# Patient Record
Sex: Female | Born: 1950
Health system: Southern US, Community
[De-identification: ages and names within clinical notes are randomized; demographics above are authoritative.]

## PROBLEM LIST (undated history)

## (undated) DIAGNOSIS — I2510 Atherosclerotic heart disease of native coronary artery without angina pectoris: Secondary | ICD-10-CM

## (undated) DIAGNOSIS — I739 Peripheral vascular disease, unspecified: Secondary | ICD-10-CM

## (undated) DIAGNOSIS — I6529 Occlusion and stenosis of unspecified carotid artery: Secondary | ICD-10-CM

## (undated) DIAGNOSIS — E785 Hyperlipidemia, unspecified: Secondary | ICD-10-CM

## (undated) HISTORY — DX: Occlusion and stenosis of unspecified carotid artery: I65.29

## (undated) HISTORY — DX: Atherosclerotic heart disease of native coronary artery without angina pectoris: I25.10

## (undated) HISTORY — DX: Peripheral vascular disease, unspecified: I73.9

## (undated) HISTORY — DX: Hyperlipidemia, unspecified: E78.5

---

## 1999-11-28 ENCOUNTER — Other Ambulatory Visit: Admission: RE | Admit: 1999-11-28 | Discharge: 1999-11-28 | Payer: Self-pay | Admitting: Orthopedic Surgery

## 1999-12-28 ENCOUNTER — Encounter: Payer: Self-pay | Admitting: Gynecology

## 1999-12-28 ENCOUNTER — Encounter: Admission: RE | Admit: 1999-12-28 | Discharge: 1999-12-28 | Payer: Self-pay | Admitting: Gynecology

## 2001-01-06 ENCOUNTER — Encounter: Admission: RE | Admit: 2001-01-06 | Discharge: 2001-01-06 | Payer: Self-pay | Admitting: Gynecology

## 2001-01-06 ENCOUNTER — Encounter: Payer: Self-pay | Admitting: Gynecology

## 2001-02-12 ENCOUNTER — Other Ambulatory Visit: Admission: RE | Admit: 2001-02-12 | Discharge: 2001-02-12 | Payer: Self-pay | Admitting: Gynecology

## 2002-01-16 ENCOUNTER — Encounter: Admission: RE | Admit: 2002-01-16 | Discharge: 2002-01-16 | Payer: Self-pay | Admitting: Gynecology

## 2002-01-16 ENCOUNTER — Encounter: Payer: Self-pay | Admitting: Gynecology

## 2002-02-16 ENCOUNTER — Other Ambulatory Visit: Admission: RE | Admit: 2002-02-16 | Discharge: 2002-02-16 | Payer: Self-pay | Admitting: Gynecology

## 2003-02-23 ENCOUNTER — Encounter: Admission: RE | Admit: 2003-02-23 | Discharge: 2003-02-23 | Payer: Self-pay | Admitting: Gynecology

## 2003-02-23 ENCOUNTER — Encounter: Payer: Self-pay | Admitting: Gynecology

## 2003-02-23 ENCOUNTER — Other Ambulatory Visit: Admission: RE | Admit: 2003-02-23 | Discharge: 2003-02-23 | Payer: Self-pay | Admitting: Gynecology

## 2003-10-04 ENCOUNTER — Ambulatory Visit (HOSPITAL_COMMUNITY): Admission: RE | Admit: 2003-10-04 | Discharge: 2003-10-04 | Payer: Self-pay | Admitting: *Deleted

## 2004-03-07 ENCOUNTER — Encounter: Admission: RE | Admit: 2004-03-07 | Discharge: 2004-03-07 | Payer: Self-pay | Admitting: Gynecology

## 2004-03-13 ENCOUNTER — Other Ambulatory Visit: Admission: RE | Admit: 2004-03-13 | Discharge: 2004-03-13 | Payer: Self-pay | Admitting: Gynecology

## 2005-03-14 ENCOUNTER — Other Ambulatory Visit: Admission: RE | Admit: 2005-03-14 | Discharge: 2005-03-14 | Payer: Self-pay | Admitting: Gynecology

## 2005-03-16 ENCOUNTER — Encounter: Admission: RE | Admit: 2005-03-16 | Discharge: 2005-03-16 | Payer: Self-pay | Admitting: Gynecology

## 2005-04-04 ENCOUNTER — Encounter: Admission: RE | Admit: 2005-04-04 | Discharge: 2005-04-04 | Payer: Self-pay | Admitting: Gynecology

## 2005-06-07 ENCOUNTER — Encounter: Admission: RE | Admit: 2005-06-07 | Discharge: 2005-06-07 | Payer: Self-pay

## 2005-07-06 ENCOUNTER — Encounter: Admission: RE | Admit: 2005-07-06 | Discharge: 2005-07-06 | Payer: Self-pay | Admitting: Neurology

## 2006-03-15 ENCOUNTER — Other Ambulatory Visit: Admission: RE | Admit: 2006-03-15 | Discharge: 2006-03-15 | Payer: Self-pay | Admitting: Gynecology

## 2006-04-01 ENCOUNTER — Encounter: Admission: RE | Admit: 2006-04-01 | Discharge: 2006-04-01 | Payer: Self-pay | Admitting: Gynecology

## 2006-04-09 ENCOUNTER — Encounter: Admission: RE | Admit: 2006-04-09 | Discharge: 2006-04-09 | Payer: Self-pay | Admitting: Gynecology

## 2007-03-18 ENCOUNTER — Other Ambulatory Visit: Admission: RE | Admit: 2007-03-18 | Discharge: 2007-03-18 | Payer: Self-pay | Admitting: Gynecology

## 2007-04-09 ENCOUNTER — Encounter: Admission: RE | Admit: 2007-04-09 | Discharge: 2007-04-09 | Payer: Self-pay | Admitting: Gynecology

## 2008-04-13 ENCOUNTER — Encounter: Admission: RE | Admit: 2008-04-13 | Discharge: 2008-04-13 | Payer: Self-pay | Admitting: Gynecology

## 2008-04-20 ENCOUNTER — Encounter: Admission: RE | Admit: 2008-04-20 | Discharge: 2008-04-20 | Payer: Self-pay | Admitting: Gynecology

## 2009-04-26 ENCOUNTER — Encounter: Admission: RE | Admit: 2009-04-26 | Discharge: 2009-04-26 | Payer: Self-pay | Admitting: Gynecology

## 2009-09-07 ENCOUNTER — Inpatient Hospital Stay (HOSPITAL_COMMUNITY): Admission: EM | Admit: 2009-09-07 | Discharge: 2009-09-14 | Payer: Self-pay | Admitting: Emergency Medicine

## 2009-09-08 ENCOUNTER — Encounter: Payer: Self-pay | Admitting: Cardiothoracic Surgery

## 2009-09-08 ENCOUNTER — Encounter (INDEPENDENT_AMBULATORY_CARE_PROVIDER_SITE_OTHER): Payer: Self-pay | Admitting: Cardiovascular Disease

## 2009-09-08 ENCOUNTER — Ambulatory Visit: Payer: Self-pay | Admitting: Cardiothoracic Surgery

## 2009-09-08 HISTORY — PX: CARDIAC CATHETERIZATION: SHX172

## 2009-09-08 HISTORY — PX: OTHER SURGICAL HISTORY: SHX169

## 2009-09-09 HISTORY — PX: CORONARY ARTERY BYPASS GRAFT: SHX141

## 2009-10-06 ENCOUNTER — Encounter: Admission: RE | Admit: 2009-10-06 | Discharge: 2009-10-06 | Payer: Self-pay | Admitting: Cardiothoracic Surgery

## 2009-10-06 ENCOUNTER — Ambulatory Visit: Payer: Self-pay | Admitting: Cardiothoracic Surgery

## 2009-11-28 HISTORY — PX: NM MYOCAR PERF WALL MOTION: HXRAD629

## 2010-01-05 ENCOUNTER — Encounter: Admission: RE | Admit: 2010-01-05 | Discharge: 2010-01-05 | Payer: Self-pay | Admitting: Internal Medicine

## 2010-05-03 ENCOUNTER — Encounter: Admission: RE | Admit: 2010-05-03 | Discharge: 2010-05-03 | Payer: Self-pay | Admitting: Gynecology

## 2010-08-13 ENCOUNTER — Encounter: Payer: Self-pay | Admitting: Gynecology

## 2010-08-14 ENCOUNTER — Encounter: Payer: Self-pay | Admitting: Gynecology

## 2010-10-12 LAB — CBC
HCT: 27.8 % — ABNORMAL LOW (ref 36.0–46.0)
HCT: 28.6 % — ABNORMAL LOW (ref 36.0–46.0)
HCT: 28.8 % — ABNORMAL LOW (ref 36.0–46.0)
HCT: 29.4 % — ABNORMAL LOW (ref 36.0–46.0)
HCT: 30 % — ABNORMAL LOW (ref 36.0–46.0)
HCT: 31 % — ABNORMAL LOW (ref 36.0–46.0)
HCT: 37.5 % (ref 36.0–46.0)
Hemoglobin: 10.1 g/dL — ABNORMAL LOW (ref 12.0–15.0)
Hemoglobin: 10.2 g/dL — ABNORMAL LOW (ref 12.0–15.0)
Hemoglobin: 10.5 g/dL — ABNORMAL LOW (ref 12.0–15.0)
Hemoglobin: 9.3 g/dL — ABNORMAL LOW (ref 12.0–15.0)
Hemoglobin: 9.7 g/dL — ABNORMAL LOW (ref 12.0–15.0)
Hemoglobin: 9.8 g/dL — ABNORMAL LOW (ref 12.0–15.0)
MCHC: 33.7 g/dL (ref 30.0–36.0)
MCHC: 33.8 g/dL (ref 30.0–36.0)
MCHC: 33.9 g/dL (ref 30.0–36.0)
MCHC: 34 g/dL (ref 30.0–36.0)
MCHC: 34 g/dL (ref 30.0–36.0)
MCHC: 34 g/dL (ref 30.0–36.0)
MCHC: 34.3 g/dL (ref 30.0–36.0)
MCV: 92.9 fL (ref 78.0–100.0)
MCV: 93.7 fL (ref 78.0–100.0)
MCV: 93.8 fL (ref 78.0–100.0)
MCV: 93.9 fL (ref 78.0–100.0)
MCV: 94 fL (ref 78.0–100.0)
MCV: 94.1 fL (ref 78.0–100.0)
MCV: 94.4 fL (ref 78.0–100.0)
MCV: 95.1 fL (ref 78.0–100.0)
Platelets: 104 10*3/uL — ABNORMAL LOW (ref 150–400)
Platelets: 105 10*3/uL — ABNORMAL LOW (ref 150–400)
Platelets: 106 10*3/uL — ABNORMAL LOW (ref 150–400)
Platelets: 110 10*3/uL — ABNORMAL LOW (ref 150–400)
Platelets: 135 10*3/uL — ABNORMAL LOW (ref 150–400)
Platelets: 185 10*3/uL (ref 150–400)
Platelets: 206 10*3/uL (ref 150–400)
Platelets: 92 10*3/uL — ABNORMAL LOW (ref 150–400)
Platelets: 94 10*3/uL — ABNORMAL LOW (ref 150–400)
RBC: 2.95 MIL/uL — ABNORMAL LOW (ref 3.87–5.11)
RBC: 3.05 MIL/uL — ABNORMAL LOW (ref 3.87–5.11)
RBC: 3.07 MIL/uL — ABNORMAL LOW (ref 3.87–5.11)
RBC: 3.17 MIL/uL — ABNORMAL LOW (ref 3.87–5.11)
RBC: 3.2 MIL/uL — ABNORMAL LOW (ref 3.87–5.11)
RBC: 3.26 MIL/uL — ABNORMAL LOW (ref 3.87–5.11)
RDW: 14.1 % (ref 11.5–15.5)
RDW: 14.1 % (ref 11.5–15.5)
RDW: 14.3 % (ref 11.5–15.5)
RDW: 14.3 % (ref 11.5–15.5)
RDW: 14.3 % (ref 11.5–15.5)
RDW: 14.3 % (ref 11.5–15.5)
RDW: 14.4 % (ref 11.5–15.5)
RDW: 14.6 % (ref 11.5–15.5)
RDW: 14.8 % (ref 11.5–15.5)
WBC: 10.2 10*3/uL (ref 4.0–10.5)
WBC: 10.2 10*3/uL (ref 4.0–10.5)
WBC: 10.7 10*3/uL — ABNORMAL HIGH (ref 4.0–10.5)
WBC: 11.6 10*3/uL — ABNORMAL HIGH (ref 4.0–10.5)
WBC: 12.9 10*3/uL — ABNORMAL HIGH (ref 4.0–10.5)
WBC: 15.2 10*3/uL — ABNORMAL HIGH (ref 4.0–10.5)
WBC: 16.3 10*3/uL — ABNORMAL HIGH (ref 4.0–10.5)
WBC: 17.3 10*3/uL — ABNORMAL HIGH (ref 4.0–10.5)
WBC: 17.4 10*3/uL — ABNORMAL HIGH (ref 4.0–10.5)

## 2010-10-12 LAB — BASIC METABOLIC PANEL
BUN: 10 mg/dL (ref 6–23)
BUN: 12 mg/dL (ref 6–23)
BUN: 8 mg/dL (ref 6–23)
BUN: 8 mg/dL (ref 6–23)
BUN: 9 mg/dL (ref 6–23)
CO2: 24 mEq/L (ref 19–32)
CO2: 25 mEq/L (ref 19–32)
CO2: 26 mEq/L (ref 19–32)
Calcium: 7.8 mg/dL — ABNORMAL LOW (ref 8.4–10.5)
Calcium: 8 mg/dL — ABNORMAL LOW (ref 8.4–10.5)
Calcium: 8.1 mg/dL — ABNORMAL LOW (ref 8.4–10.5)
Calcium: 8.2 mg/dL — ABNORMAL LOW (ref 8.4–10.5)
Chloride: 104 mEq/L (ref 96–112)
Chloride: 105 mEq/L (ref 96–112)
Chloride: 106 mEq/L (ref 96–112)
Chloride: 110 mEq/L (ref 96–112)
Chloride: 114 mEq/L — ABNORMAL HIGH (ref 96–112)
Creatinine, Ser: 0.71 mg/dL (ref 0.4–1.2)
Creatinine, Ser: 0.74 mg/dL (ref 0.4–1.2)
Creatinine, Ser: 0.76 mg/dL (ref 0.4–1.2)
Creatinine, Ser: 0.76 mg/dL (ref 0.4–1.2)
Creatinine, Ser: 0.77 mg/dL (ref 0.4–1.2)
Creatinine, Ser: 0.8 mg/dL (ref 0.4–1.2)
GFR calc Af Amer: >60 mL/min (ref >60)
GFR calc Af Amer: >60 mL/min (ref >60)
GFR calc Af Amer: >60 mL/min (ref >60)
GFR calc Af Amer: >60 mL/min (ref >60)
GFR calc non Af Amer: >60 mL/min (ref >60)
GFR calc non Af Amer: >60 mL/min (ref >60)
GFR calc non Af Amer: >60 mL/min (ref >60)
GFR calc non Af Amer: >60 mL/min (ref >60)
GFR calc non Af Amer: >60 mL/min (ref >60)
Glucose, Bld: 108 mg/dL — ABNORMAL HIGH (ref 70–99)
Glucose, Bld: 120 mg/dL — ABNORMAL HIGH (ref 70–99)
Glucose, Bld: 123 mg/dL — ABNORMAL HIGH (ref 70–99)
Glucose, Bld: 125 mg/dL — ABNORMAL HIGH (ref 70–99)
Glucose, Bld: 143 mg/dL — ABNORMAL HIGH (ref 70–99)
Potassium: 3.6 mEq/L (ref 3.5–5.1)
Potassium: 3.7 mEq/L (ref 3.5–5.1)
Potassium: 4 mEq/L (ref 3.5–5.1)
Potassium: 4.1 mEq/L (ref 3.5–5.1)
Sodium: 136 mEq/L (ref 135–145)
Sodium: 138 mEq/L (ref 135–145)
Sodium: 141 mEq/L (ref 135–145)

## 2010-10-12 LAB — CK TOTAL AND CKMB (NOT AT ARMC)
CK, MB: 1 ng/mL (ref 0.3–4.0)
Relative Index: INVALID (ref 0.0–2.5)
Total CK: 37 U/L (ref 7–177)

## 2010-10-12 LAB — POCT I-STAT 4, (NA,K, GLUC, HGB,HCT)
Glucose, Bld: 86 mg/dL (ref 70–99)
Glucose, Bld: 98 mg/dL (ref 70–99)
HCT: 26 % — ABNORMAL LOW (ref 36.0–46.0)
HCT: 34 % — ABNORMAL LOW (ref 36.0–46.0)
HCT: 36 % (ref 36.0–46.0)
HCT: 45 % (ref 36.0–46.0)
Hemoglobin: 12.2 g/dL (ref 12.0–15.0)
Hemoglobin: 15.3 g/dL — ABNORMAL HIGH (ref 12.0–15.0)
Hemoglobin: 8.5 g/dL — ABNORMAL LOW (ref 12.0–15.0)
Hemoglobin: 8.8 g/dL — ABNORMAL LOW (ref 12.0–15.0)
Potassium: 3.5 mEq/L (ref 3.5–5.1)
Potassium: 4 mEq/L (ref 3.5–5.1)
Sodium: 145 mEq/L (ref 135–145)

## 2010-10-12 LAB — POCT I-STAT, CHEM 8
Calcium, Ion: 1.12 mmol/L (ref 1.12–1.32)
Calcium, Ion: 1.19 mmol/L (ref 1.12–1.32)
Creatinine, Ser: 0.8 mg/dL (ref 0.4–1.2)
Glucose, Bld: 136 mg/dL — ABNORMAL HIGH (ref 70–99)
Glucose, Bld: 166 mg/dL — ABNORMAL HIGH (ref 70–99)
HCT: 29 % — ABNORMAL LOW (ref 36.0–46.0)
Hemoglobin: 10.2 g/dL — ABNORMAL LOW (ref 12.0–15.0)
TCO2: 23 mmol/L (ref 0–100)
TCO2: 23 mmol/L (ref 0–100)

## 2010-10-12 LAB — BLOOD GAS, ARTERIAL
Acid-base deficit: 0.6 mmol/L (ref 0.0–2.0)
Bicarbonate: 23.4 mEq/L (ref 20.0–24.0)
Drawn by: 129711
O2 Saturation: 96.8 %
Patient temperature: 98.6
TCO2: 24.5 mmol/L (ref 0–100)
pCO2 arterial: 37 mmHg (ref 35.0–45.0)
pH, Arterial: 7.417 — ABNORMAL HIGH (ref 7.350–7.400)
pO2, Arterial: 84.9 mmHg (ref 80.0–100.0)

## 2010-10-12 LAB — COMPREHENSIVE METABOLIC PANEL
ALT: 21 U/L (ref 0–35)
Albumin: 3.8 g/dL (ref 3.5–5.2)
Calcium: 8.8 mg/dL (ref 8.4–10.5)
Chloride: 106 mEq/L (ref 96–112)
GFR calc non Af Amer: >60 mL/min (ref >60)
Sodium: 139 mEq/L (ref 135–145)

## 2010-10-12 LAB — CREATININE, SERUM
Creatinine, Ser: 0.71 mg/dL (ref 0.4–1.2)
Creatinine, Ser: 0.84 mg/dL (ref 0.4–1.2)
GFR calc Af Amer: >60 mL/min (ref >60)
GFR calc Af Amer: >60 mL/min (ref >60)
GFR calc non Af Amer: >60 mL/min (ref >60)
GFR calc non Af Amer: >60 mL/min (ref >60)

## 2010-10-12 LAB — GLUCOSE, CAPILLARY
Glucose-Capillary: 108 mg/dL — ABNORMAL HIGH (ref 70–99)
Glucose-Capillary: 116 mg/dL — ABNORMAL HIGH (ref 70–99)
Glucose-Capillary: 119 mg/dL — ABNORMAL HIGH (ref 70–99)
Glucose-Capillary: 130 mg/dL — ABNORMAL HIGH (ref 70–99)
Glucose-Capillary: 131 mg/dL — ABNORMAL HIGH (ref 70–99)
Glucose-Capillary: 180 mg/dL — ABNORMAL HIGH (ref 70–99)
Glucose-Capillary: 83 mg/dL (ref 70–99)

## 2010-10-12 LAB — POCT I-STAT 3, ART BLOOD GAS (G3+)
Bicarbonate: 23.5 mEq/L (ref 20.0–24.0)
Bicarbonate: 24 mEq/L (ref 20.0–24.0)
Bicarbonate: 24.8 mEq/L — ABNORMAL HIGH (ref 20.0–24.0)
O2 Saturation: 100 %
O2 Saturation: 97 %
O2 Saturation: 98 %
Patient temperature: 35.4
TCO2: 24 mmol/L (ref 0–100)
TCO2: 25 mmol/L (ref 0–100)
TCO2: 26 mmol/L (ref 0–100)
pCO2 arterial: 35.4 mmHg (ref 35.0–45.0)
pCO2 arterial: 37.5 mmHg (ref 35.0–45.0)
pCO2 arterial: 40.1 mmHg (ref 35.0–45.0)
pH, Arterial: 7.386 (ref 7.350–7.400)
pH, Arterial: 7.453 — ABNORMAL HIGH (ref 7.350–7.400)
pO2, Arterial: 323 mmHg — ABNORMAL HIGH (ref 80.0–100.0)
pO2, Arterial: 74 mmHg — ABNORMAL LOW (ref 80.0–100.0)

## 2010-10-12 LAB — MAGNESIUM
Magnesium: 2.4 mg/dL (ref 1.5–2.5)
Magnesium: 2.5 mg/dL (ref 1.5–2.5)
Magnesium: 2.9 mg/dL — ABNORMAL HIGH (ref 1.5–2.5)

## 2010-10-12 LAB — TYPE AND SCREEN
ABO/RH(D): O POS
Antibody Screen: NEGATIVE

## 2010-10-12 LAB — POCT I-STAT 3, VENOUS BLOOD GAS (G3P V)
Acid-base deficit: 3 mmol/L — ABNORMAL HIGH (ref 0.0–2.0)
Bicarbonate: 23 mEq/L (ref 20.0–24.0)
O2 Saturation: 67 %
TCO2: 24 mmol/L (ref 0–100)
pO2, Ven: 37 mmHg (ref 30.0–45.0)

## 2010-10-12 LAB — URINALYSIS, ROUTINE W REFLEX MICROSCOPIC
Bilirubin Urine: NEGATIVE
Glucose, UA: NEGATIVE mg/dL
Ketones, ur: NEGATIVE mg/dL
Nitrite: NEGATIVE
Protein, ur: NEGATIVE mg/dL
Specific Gravity, Urine: 1.013 (ref 1.005–1.030)
Urobilinogen, UA: 0.2 mg/dL (ref 0.0–1.0)
pH: 7 (ref 5.0–8.0)

## 2010-10-12 LAB — LIPID PANEL
Triglycerides: 153 mg/dL — ABNORMAL HIGH (ref <150)
VLDL: 31 mg/dL (ref 0–40)

## 2010-10-12 LAB — BRAIN NATRIURETIC PEPTIDE: Pro B Natriuretic peptide (BNP): 337 pg/mL — ABNORMAL HIGH (ref 0.0–100.0)

## 2010-10-12 LAB — MRSA PCR SCREENING: MRSA by PCR: NEGATIVE

## 2010-10-12 LAB — HEPARIN LEVEL (UNFRACTIONATED)
Heparin Unfractionated: 0.25 IU/mL — ABNORMAL LOW (ref 0.30–0.70)
Heparin Unfractionated: 0.26 IU/mL — ABNORMAL LOW (ref 0.30–0.70)
Heparin Unfractionated: 0.48 IU/mL (ref 0.30–0.70)

## 2010-10-12 LAB — CARDIAC PANEL(CRET KIN+CKTOT+MB+TROPI)
CK, MB: 4 ng/mL (ref 0.3–4.0)
Relative Index: 0.9 (ref 0.0–2.5)
Relative Index: INVALID (ref 0.0–2.5)
Total CK: 436 U/L — ABNORMAL HIGH (ref 7–177)
Troponin I: 0.08 ng/mL — ABNORMAL HIGH (ref 0.00–0.06)
Troponin I: 0.09 ng/mL — ABNORMAL HIGH (ref 0.00–0.06)

## 2010-10-12 LAB — URINE MICROSCOPIC-ADD ON

## 2010-10-12 LAB — PROTIME-INR
INR: 1.42 (ref 0.00–1.49)
Prothrombin Time: 13.8 seconds (ref 11.6–15.2)
Prothrombin Time: 17.2 seconds — ABNORMAL HIGH (ref 11.6–15.2)

## 2010-10-12 LAB — TSH: TSH: 0.797 u[IU]/mL (ref 0.350–4.500)

## 2010-10-12 LAB — PLATELET COUNT: Platelets: 146 10*3/uL — ABNORMAL LOW (ref 150–400)

## 2010-10-12 LAB — HEMOGLOBIN A1C: Hgb A1c MFr Bld: 5.5 % (ref 4.6–6.1)

## 2010-10-12 LAB — APTT: aPTT: 40 seconds — ABNORMAL HIGH (ref 24–37)

## 2010-10-12 LAB — TROPONIN I: Troponin I: 0.09 ng/mL — ABNORMAL HIGH (ref 0.00–0.06)

## 2010-12-05 NOTE — Assessment & Plan Note (Signed)
OFFICE VISIT   Tammy [name], Tammy [name] A  DOB:  1950-09-17                                        October 06, 2009  CHART #:  [id number]   HISTORY:  The patient returns to the office today in followup visit  after a coronary artery bypass grafting on September 09, 2009 for  critical left main disease and unstable angina.  She is making excellent  progress at home, is now increasing her activity.  She has had no  recurrent angina or evidence of congestive heart failure.  She is to  start in cardiac rehab program in the coming week.   PHYSICAL EXAMINATION:  Her blood pressure 128/76, pulse is 64 and  regular, respiratory rate is 18, and O2 sats 98%.  Her sternum is stable  and well healed.  She does have some slight paresthesias over the left  chest, but otherwise the incision and sternum is healing well.  Her  lungs are clear bilaterally.  She has no pedal edema.   Followup chest x-ray shows clear lung fields bilaterally.   MEDICATIONS:  She continues on:  1. Aspirin 325 mg a day.  2. Ramipril 2.5 mg a day.  3. Crestor 20 mg a day.  4. Toprol-XL 25 mg a day.  5. Methotrexate 2.5 mg 8 tablets every Sunday.  6. Tramadol 50 mg a day.   IMPRESSION:  Overall, I am very pleased with her progress.  I have not  made her a return appointment to see me.  She is followed by Dr. Allyson Sabal  who plans to see her back again in 1 month.  With her work, she is going  to return to work by Dec 07, 2009.   Sheliah Plane, MD  Electronically Signed   EG/MEDQ  D:  10/06/2009  T:  10/07/2009  Job:  [id number]   cc:   Nanetta Batty, M.D.  Juline Patch, M.D.

## 2010-12-08 NOTE — Op Note (Signed)
NAME:  Tammy [name], Tammy [name]                           ACCOUNT NO.:  [id number]   MEDICAL RECORD NO.:  [id number]                   PATIENT TYPE:  AMB   LOCATION:  ENDO                                 FACILITY:  [facility]   PHYSICIAN:  Georgiana Spinner, M.D.                 DATE OF BIRTH:  19-Oct-1950   DATE OF PROCEDURE:  10/04/2003  DATE OF DISCHARGE:                                 OPERATIVE REPORT   PROCEDURE:  Colonoscopy.   INDICATIONS:  Colon cancer screening.  Family history of colon cancer.   ANESTHESIA:  1. Demerol 80 mg.  2. Versed 8.5 mg.   DESCRIPTION OF PROCEDURE:  With patient mildly sedated in the left lateral  decubitus position, the Olympus videoscopic colonoscope was inserted in the  rectum and passed under direct vision to the cecum, identified by the base  of the cecum and the ileocecal valve, both of which were photographed.  From  this point, the colonoscope was slowly withdrawn, taking circumferential  views of the colonic mucosa, stopping in the rectum which appeared normal on  direct and retroflex view.  The endoscope was straightened and withdrawn.  The patient's vital signs and pulse oximeter remained stable.  The patient  tolerated the procedure well without apparent complications.   FINDINGS:  Unremarkable examination.   PLAN:  Because of patient's family history, repeat in 5 years.                                               Georgiana Spinner, M.D.    GMO/MEDQ  D:  10/04/2003  T:  10/04/2003  Job:  [id number]

## 2011-04-03 ENCOUNTER — Other Ambulatory Visit: Payer: Self-pay | Admitting: Gynecology

## 2011-04-03 DIAGNOSIS — Z1231 Encounter for screening mammogram for malignant neoplasm of breast: Secondary | ICD-10-CM

## 2011-04-12 ENCOUNTER — Other Ambulatory Visit: Payer: Self-pay | Admitting: Gynecology

## 2011-04-12 ENCOUNTER — Ambulatory Visit (HOSPITAL_BASED_OUTPATIENT_CLINIC_OR_DEPARTMENT_OTHER)
Admission: RE | Admit: 2011-04-12 | Discharge: 2011-04-12 | Disposition: A | Discharge status: Home / Self Care | Payer: BC Managed Care – PPO | Source: Ambulatory Visit | Attending: Gynecology | Admitting: Gynecology

## 2011-04-12 DIAGNOSIS — N939 Abnormal uterine and vaginal bleeding, unspecified: Secondary | ICD-10-CM | POA: Insufficient documentation

## 2011-04-12 DIAGNOSIS — Z01812 Encounter for preprocedural laboratory examination: Secondary | ICD-10-CM | POA: Insufficient documentation

## 2011-04-12 DIAGNOSIS — N926 Irregular menstruation, unspecified: Secondary | ICD-10-CM | POA: Insufficient documentation

## 2011-04-12 DIAGNOSIS — I2510 Atherosclerotic heart disease of native coronary artery without angina pectoris: Secondary | ICD-10-CM | POA: Insufficient documentation

## 2011-04-12 DIAGNOSIS — Z951 Presence of aortocoronary bypass graft: Secondary | ICD-10-CM | POA: Insufficient documentation

## 2011-04-12 DIAGNOSIS — D250 Submucous leiomyoma of uterus: Secondary | ICD-10-CM | POA: Insufficient documentation

## 2011-04-12 DIAGNOSIS — I10 Essential (primary) hypertension: Secondary | ICD-10-CM | POA: Insufficient documentation

## 2011-04-12 HISTORY — PX: HYSTEROSCOPY: SHX211

## 2011-04-12 LAB — POCT I-STAT 4, (NA,K, GLUC, HGB,HCT)
Glucose, Bld: 106 mg/dL — ABNORMAL HIGH (ref 70–99)
HCT: 40 % (ref 36.0–46.0)
Sodium: 141 mEq/L (ref 135–145)

## 2011-04-25 NOTE — Op Note (Signed)
  [name], [name]                 ACCOUNT NO.:  [id number]  MEDICAL RECORD NO.:  [id number]  LOCATION:                                 FACILITY:  PHYSICIAN:  Gretta Cool, M.D. DATE OF BIRTH:  January 05, 1951  DATE OF PROCEDURE:  04/12/2011 DATE OF DISCHARGE:                              OPERATIVE REPORT   PREOPERATIVE DIAGNOSIS:  Multiple submucous fibroids versus polyps with abnormal uterine bleeding, postmenopausal.  POSTOPERATIVE DIAGNOSIS:  Multiple submucous fibroids versus polyps with abnormal uterine bleeding, postmenopausal.  PROCEDURE:  Hysteroscopy, resection of multiple submucous fibroids.  SURGEON:  Gretta Cool, M.D.  ANESTHESIA:  IV sedation and paracervical block.  DESCRIPTION OF PROCEDURE:  Under excellent anesthesia as above, the patient was prepped and draped in Allen stirrups in lithotomy position. The cervix was grasped with a single-tooth tenaculum and progressively dilated to accommodate the 7-mm resectoscope.  The resectoscope was then introduced in the cavity photographed.  There were multiple fibroids noted bulging into the endometrial cavity, the largest one was near the fundus anteriorly.  At this point, the smaller proximal fibroids were resected first and then the larger fibroid was progressively resected and rocked back and forth until it delivered out of the capsule in its entirety.  It was progressively resected and the tissue submitted for pathologic exam.  Bleeding points were treated by VaporTrode electrode to control the bleeding.  At the end of the procedure with reduced pressure, there was minimal bleeding.  There was 125 mL fluid deficit at the end of the procedure.          ______________________________ Gretta Cool, M.D.     CWL/MEDQ  D:  04/12/2011  T:  04/12/2011  Job:  [id number]  cc:   Alben Deeds, MD Fax: [phone]  Juline Patch, M.D. Fax: [phone]  Electronically Signed by Beather Arbour M.D. on 04/25/2011  03:58:26 PM

## 2011-05-08 ENCOUNTER — Ambulatory Visit: Payer: BC Managed Care – PPO

## 2011-05-10 ENCOUNTER — Other Ambulatory Visit: Payer: Self-pay | Admitting: Gynecology

## 2011-05-15 ENCOUNTER — Ambulatory Visit
Admission: RE | Admit: 2011-05-15 | Discharge: 2011-05-15 | Disposition: A | Discharge status: Home / Self Care | Payer: BC Managed Care – PPO | Source: Ambulatory Visit | Attending: Gynecology | Admitting: Gynecology

## 2011-05-15 DIAGNOSIS — Z1231 Encounter for screening mammogram for malignant neoplasm of breast: Secondary | ICD-10-CM

## 2011-08-15 IMAGING — CR DG CHEST 1V PORT
1 series · 1 of 1 positions shown · non-contrast
Comparison: Chest 09/09/2009.

CLINICAL DATA: Status post CABG.

PORTABLE CHEST - 1 VIEW

[view not recorded]
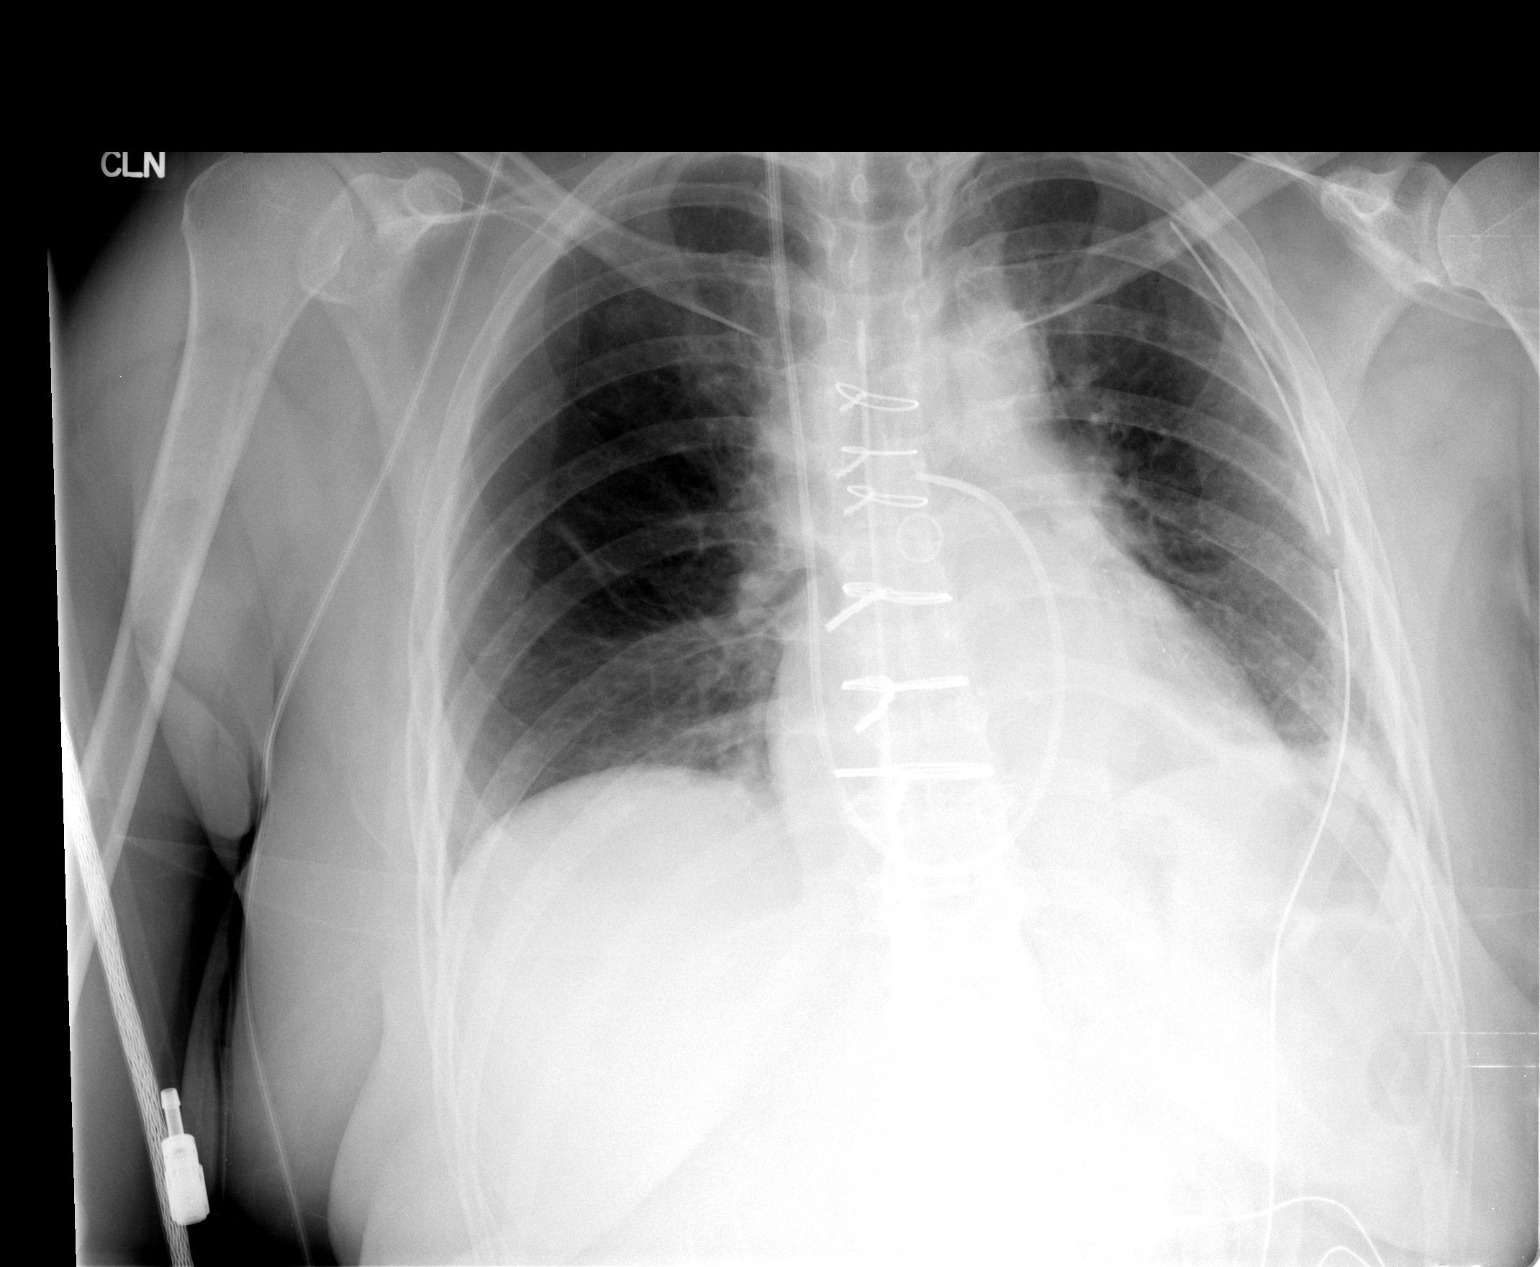

[1 of 1 positions shown; findings below may reference images not displayed]

FINDINGS: The patient's NG tube and ET tube have been removed.
Support apparatus is otherwise unchanged.  No pneumothorax.  Mild
left basilar atelectasis is now seen.  Right lung is clear.  Heart
size upper normal.
IMPRESSION: 1.  Support apparatus as above.  No pneumothorax.
2.  Mild left basilar atelectasis.

## 2011-08-18 IMAGING — CR DG CHEST 2V
2 series · 2 of 2 positions shown · non-contrast
Comparison: 09/12/2009

CLINICAL DATA: CABG, shortness of breath, cough.

CHEST - 2 VIEW

[w chest pa]
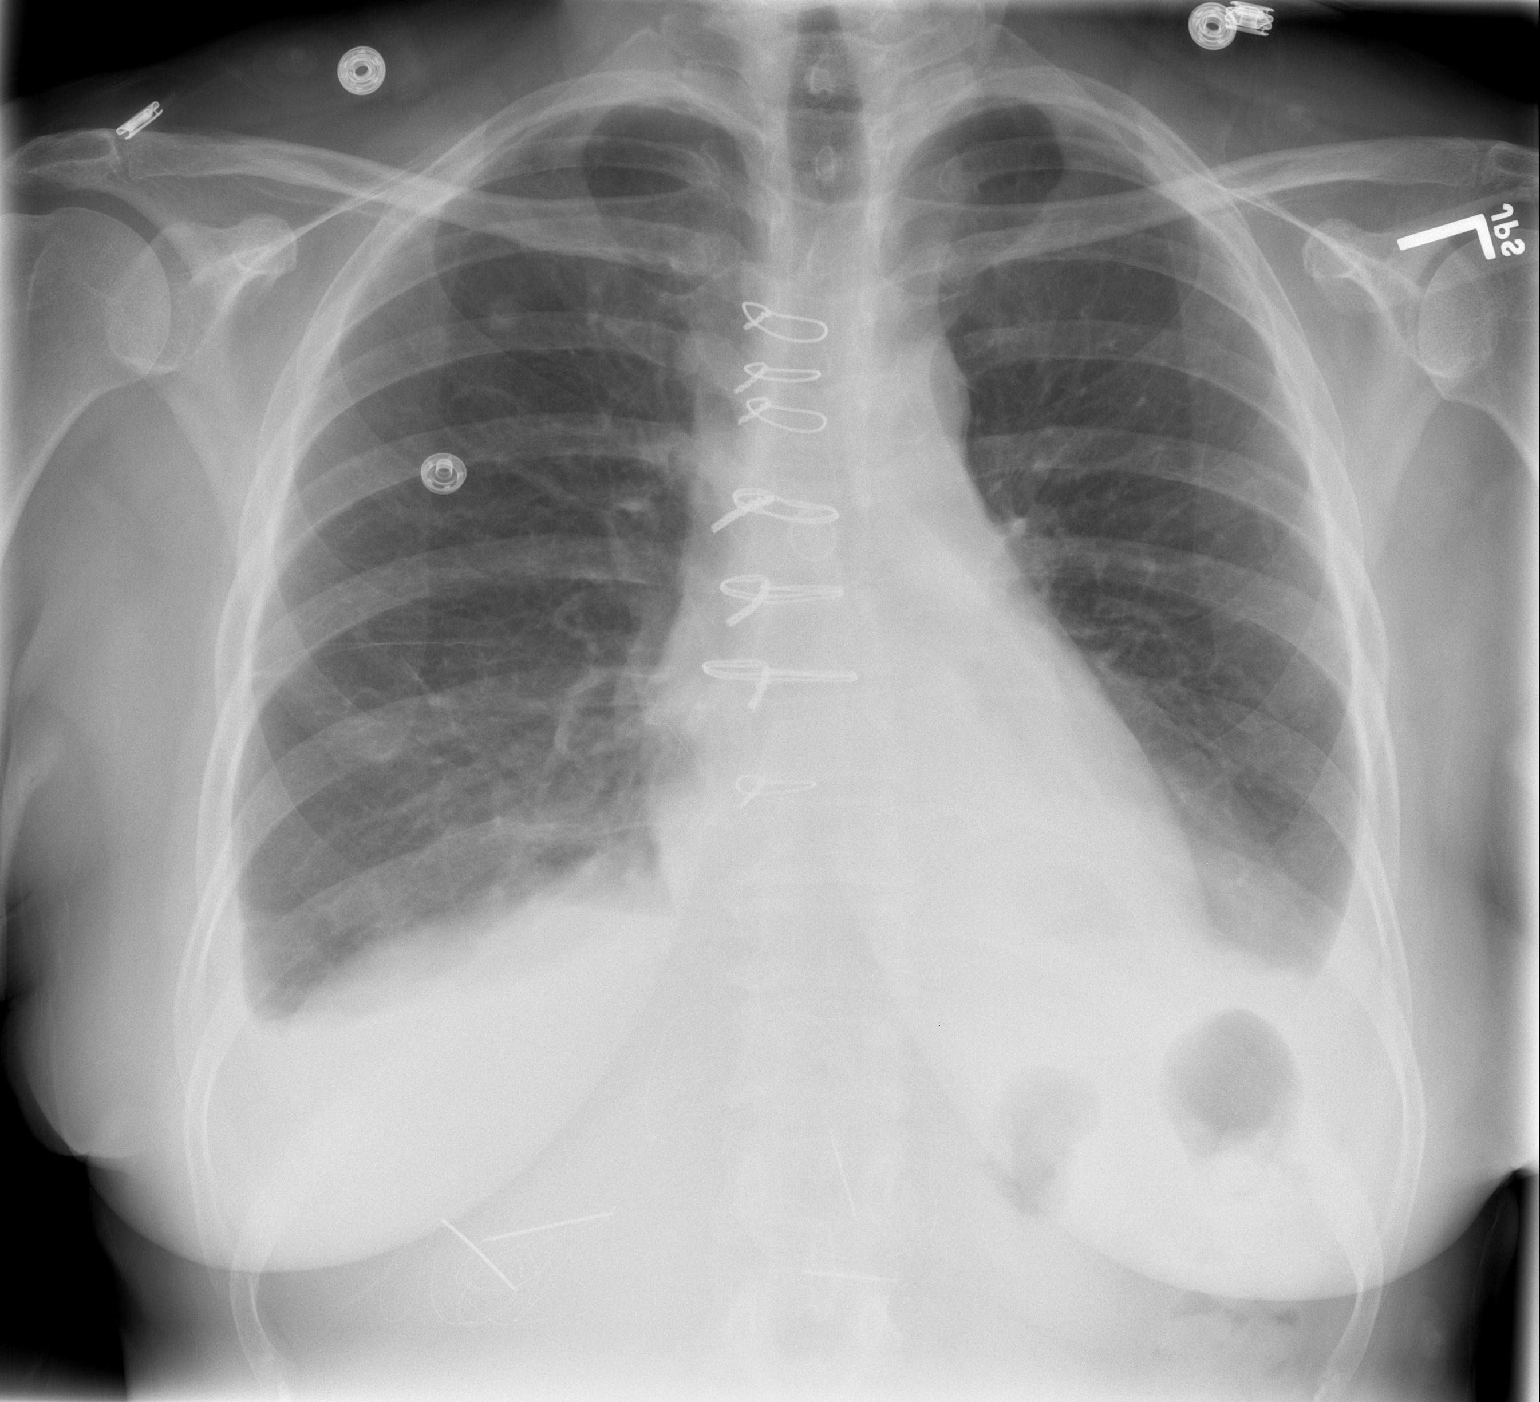

[w chest lat]
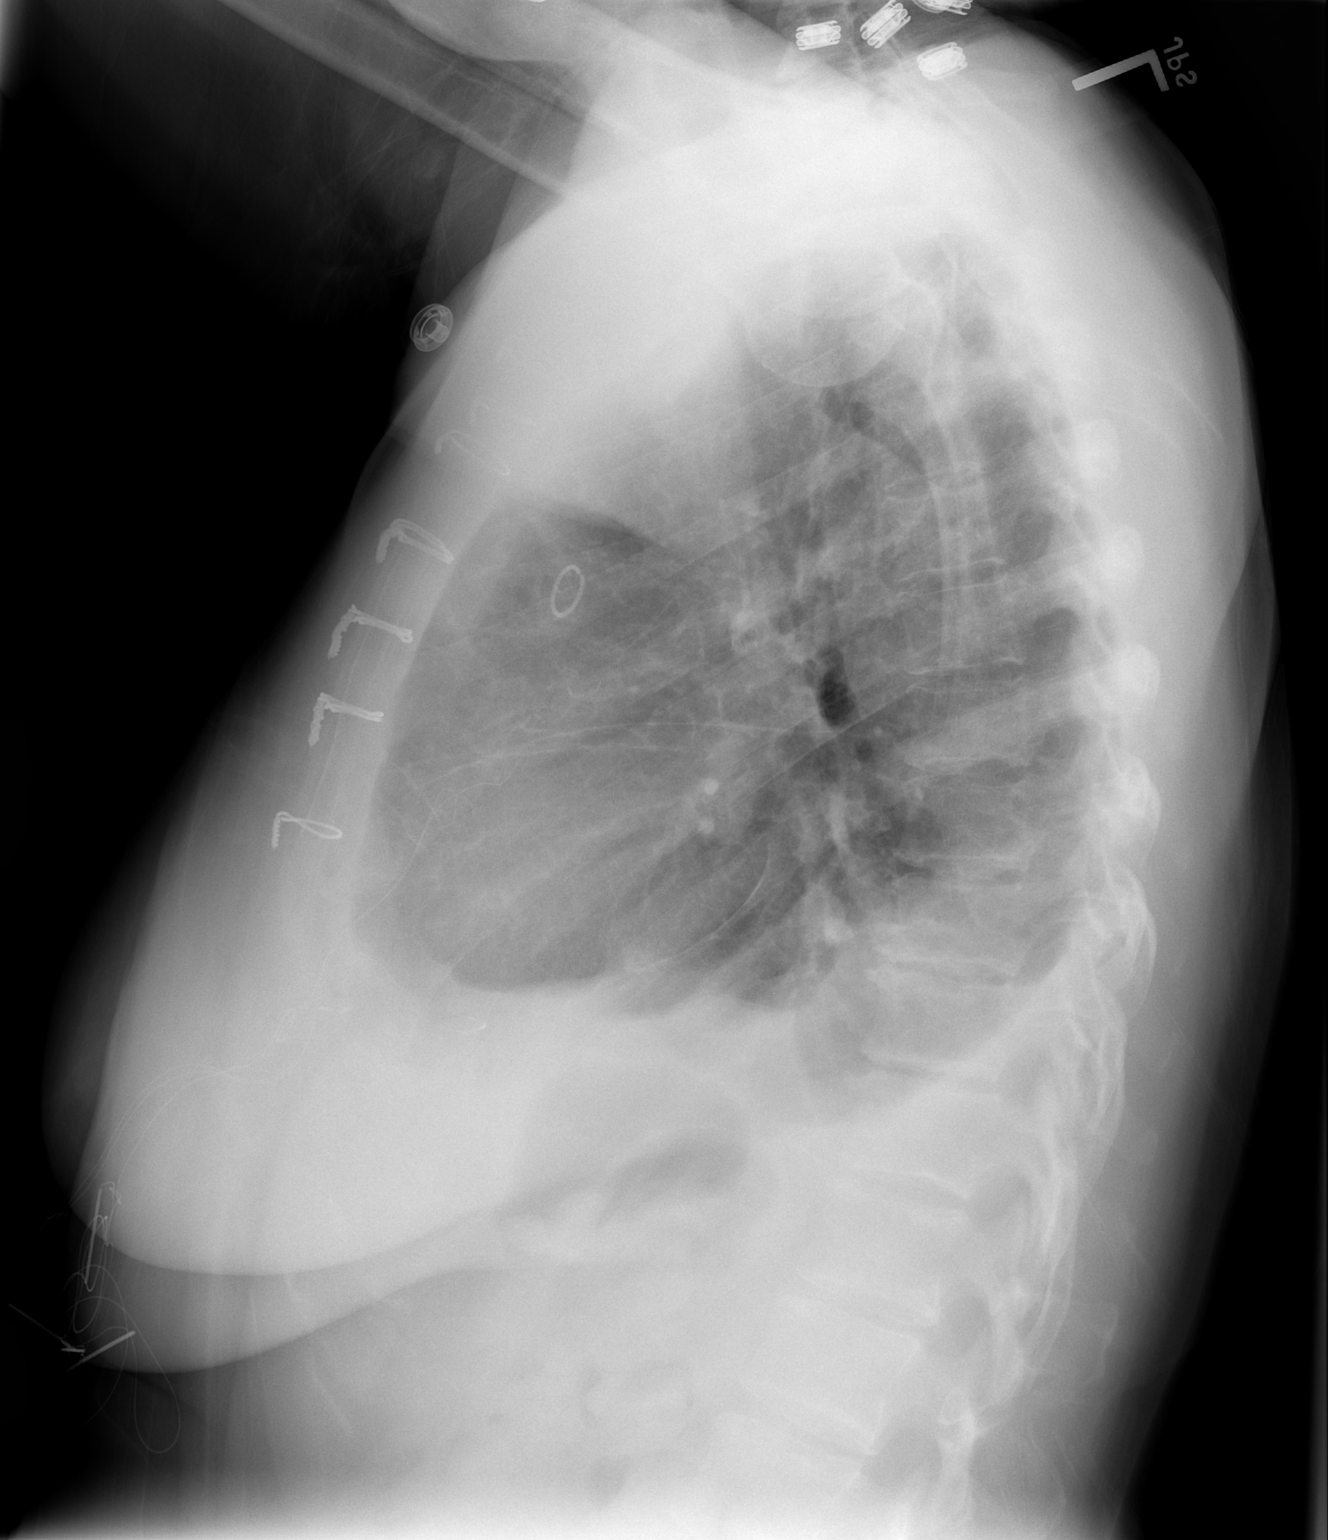

[2 of 2 positions shown; findings below may reference images not displayed]

FINDINGS: Prior CABG.  Heart is normal size.  There is bibasilar
atelectasis and small bilateral effusions, similar to prior study.
No pneumothorax.  No acute bony abnormality.
IMPRESSION: Stable bibasilar atelectasis and small effusions.

## 2011-10-02 ENCOUNTER — Other Ambulatory Visit: Payer: Self-pay

## 2011-11-22 HISTORY — PX: OTHER SURGICAL HISTORY: SHX169

## 2012-04-07 ENCOUNTER — Other Ambulatory Visit: Payer: Self-pay | Admitting: Gynecology

## 2012-04-07 DIAGNOSIS — Z1231 Encounter for screening mammogram for malignant neoplasm of breast: Secondary | ICD-10-CM

## 2012-05-12 ENCOUNTER — Other Ambulatory Visit: Payer: Self-pay | Admitting: Gynecology

## 2012-05-15 ENCOUNTER — Ambulatory Visit
Admission: RE | Admit: 2012-05-15 | Discharge: 2012-05-15 | Disposition: A | Discharge status: Home / Self Care | Payer: BC Managed Care – PPO | Source: Ambulatory Visit | Attending: Gynecology | Admitting: Gynecology

## 2012-05-15 DIAGNOSIS — Z1231 Encounter for screening mammogram for malignant neoplasm of breast: Secondary | ICD-10-CM

## 2012-05-27 HISTORY — PX: OTHER SURGICAL HISTORY: SHX169

## 2012-12-17 ENCOUNTER — Encounter: Payer: Self-pay | Admitting: *Deleted

## 2012-12-17 ENCOUNTER — Encounter: Payer: Self-pay | Admitting: Cardiovascular Disease

## 2012-12-18 ENCOUNTER — Encounter: Payer: Self-pay | Admitting: Cardiovascular Disease

## 2012-12-18 ENCOUNTER — Ambulatory Visit (INDEPENDENT_AMBULATORY_CARE_PROVIDER_SITE_OTHER): Payer: BC Managed Care – PPO | Admitting: Cardiovascular Disease

## 2012-12-18 VITALS — BP 116/72 | HR 59 | Ht 62.0 in | Wt 186.0 lb

## 2012-12-18 DIAGNOSIS — E785 Hyperlipidemia, unspecified: Secondary | ICD-10-CM | POA: Insufficient documentation

## 2012-12-18 DIAGNOSIS — I2510 Atherosclerotic heart disease of native coronary artery without angina pectoris: Secondary | ICD-10-CM | POA: Insufficient documentation

## 2012-12-18 NOTE — Assessment & Plan Note (Signed)
Denies chest pain or shortness of breath now 3 years status post coronary bypass grafting

## 2012-12-18 NOTE — Patient Instructions (Addendum)
Your physician wants you to follow-up in: 6 MONTHS WITH AN EXTENDER AND 12 MONTHS WITH DR BERRY. You will receive a reminder letter in the mail two months in advance. If you don't receive a letter, please call our office to schedule the follow-up appointment.  

## 2012-12-18 NOTE — Assessment & Plan Note (Signed)
Recent lab work performed by her primary care physician in February revealed a total cholesterol of 150, LDL of 90 and HDL of 49. This represents an increase her HDL compared to the prior year from 77. We talked about diet.

## 2012-12-18 NOTE — Progress Notes (Signed)
12/18/2012 Tammy [name]   14-Nov-1950  [id number]  Primary Physician Juline Patch, MD Primary Cardiologist: Runell Gess MD Roseanne Reno   HPI:  The patient is a 62 year old mildly overweight married Caucasian female mother of 1, grandmother of 2 grandchildren who I last saw 6 months ago. She has a history of hyperlipidemia and discontinued tobacco abuse. She had a positive Myoview April 07, 2010 with inferolateral ischemia which led to a heart cath February 17th that showed left main 2-vessel disease and normal LV function. She had coronary artery bypass grafting x3 by Dr. Ofilia Neas the following day with a LIMA to her LAD, a vein to the circumflex marginal branch. Her postoperative course has been uncomplicated. She completed cardiac rehab. Followup Myoview normalized without any evidence of any ischemic abnormality. Dr. Ricki Miller follows her lipid profile which when last performed in February revealed a total cholesterol of 125, LDL 55, HDL 48.since I last saw her she denies chest pain or shortness of breath. She did rip higher and at the first year after working 30 years and her current job. Recent lab work performed Dr. Alverda Skeans revealed an increase in LDL from 55 in 2013 to 90 this year.    Current Outpatient Prescriptions  Medication Sig Dispense Refill  . aspirin EC 81 MG tablet Take 81 mg by mouth daily.      Marland Kitchen estradiol (VIVELLE-DOT) 0.075 MG/24HR Apply 1/2 patch to skin 2 x week      . losartan (COZAAR) 50 MG tablet Take 50 mg by mouth daily.      . methotrexate (RHEUMATREX) 2.5 MG tablet Take 2.5 mg by mouth once a week. Caution:Chemotherapy. Protect from light.  Take 8 tablet weekly      . metoprolol succinate (TOPROL-XL) 25 MG 24 hr tablet Take 25 mg by mouth daily.      . nitroGLYCERIN (NITROSTAT) 0.4 MG SL tablet Place 0.4 mg under the tongue every 5 (five) minutes as needed for chest pain.      . Progesterone Micronized (PROMETRIUM PO) Take by mouth. Take every morning       . rosuvastatin (CRESTOR) 20 MG tablet Take 20 mg by mouth daily.       No current facility-administered medications for this visit.    Allergies  Allergen Reactions  . Ramipril   . Sulfa Antibiotics     History   Social History  . Marital Status: Married    Spouse Name: N/A    Number of Children: N/A  . Years of Education: N/A   Occupational History  . Not on file.   Social History Main Topics  . Smoking status: Former Smoker    Quit date: 12/19/1987  . Smokeless tobacco: Not on file  . Alcohol Use: Not on file  . Drug Use: Not on file  . Sexually Active: Not on file   Other Topics Concern  . Not on file   Social History Narrative  . No narrative on file     Review of Systems: General: negative for chills, fever, night sweats or weight changes.  Cardiovascular: negative for chest pain, dyspnea on exertion, edema, orthopnea, palpitations, paroxysmal nocturnal dyspnea or shortness of breath Dermatological: negative for rash Respiratory: negative for cough or wheezing Urologic: negative for hematuria Abdominal: negative for nausea, vomiting, diarrhea, bright red blood per rectum, melena, or hematemesis Neurologic: negative for visual changes, syncope, or dizziness All other systems reviewed and are otherwise negative except as noted above.    Blood pressure  116/72, pulse 59, height 5\' 2"  (1.575 m), weight 186 lb (84.369 kg).  General appearance: alert and no distress Neck: no adenopathy, no carotid bruit, no JVD, supple, symmetrical, trachea midline and thyroid not enlarged, symmetric, no tenderness/mass/nodules Lungs: clear to auscultation bilaterally Heart: regular rate and rhythm, S1, S2 normal, no murmur, click, rub or gallop Extremities: extremities normal, atraumatic, no cyanosis or edema  EKG sinus bradycardia at 59 without ST or T wave changes  ASSESSMENT AND PLAN:   CAD (coronary artery disease) Denies chest pain or shortness of breath now 3  years status post coronary bypass grafting  Hyperlipidemia Recent lab work performed by her primary care physician in February revealed a total cholesterol of 150, LDL of 90 and HDL of 49. This represents an increase her HDL compared to the prior year from 24. We talked about diet.      Runell Gess MD FACP,FACC,FAHA, [facility] 12/18/2012 9:34 AM

## 2013-06-03 ENCOUNTER — Other Ambulatory Visit (HOSPITAL_COMMUNITY): Payer: Self-pay | Admitting: Cardiovascular Disease

## 2013-06-10 ENCOUNTER — Ambulatory Visit (HOSPITAL_COMMUNITY)
Admission: RE | Admit: 2013-06-10 | Discharge: 2013-06-10 | Disposition: A | Discharge status: Home / Self Care | Payer: BC Managed Care – PPO | Source: Ambulatory Visit | Attending: Cardiovascular Disease | Admitting: Cardiovascular Disease

## 2013-06-10 DIAGNOSIS — I6529 Occlusion and stenosis of unspecified carotid artery: Secondary | ICD-10-CM | POA: Insufficient documentation

## 2013-06-10 NOTE — Progress Notes (Signed)
Carotid Duplex Completed. °Brianna L Mazza,RVT °

## 2013-06-17 ENCOUNTER — Ambulatory Visit (INDEPENDENT_AMBULATORY_CARE_PROVIDER_SITE_OTHER): Payer: BC Managed Care – PPO | Admitting: Cardiology

## 2013-06-17 ENCOUNTER — Ambulatory Visit: Payer: BC Managed Care – PPO | Admitting: Physician Assistant

## 2013-06-17 ENCOUNTER — Ambulatory Visit: Payer: BC Managed Care – PPO | Admitting: Cardiology

## 2013-06-17 ENCOUNTER — Encounter: Payer: Self-pay | Admitting: Cardiology

## 2013-06-17 VITALS — BP 120/70 | HR 61 | Ht 62.0 in | Wt 184.0 lb

## 2013-06-17 DIAGNOSIS — I779 Disorder of arteries and arterioles, unspecified: Secondary | ICD-10-CM

## 2013-06-17 DIAGNOSIS — E785 Hyperlipidemia, unspecified: Secondary | ICD-10-CM

## 2013-06-17 DIAGNOSIS — I2510 Atherosclerotic heart disease of native coronary artery without angina pectoris: Secondary | ICD-10-CM

## 2013-06-17 HISTORY — DX: Disorder of arteries and arterioles, unspecified: I77.9

## 2013-06-17 NOTE — Assessment & Plan Note (Signed)
Stable no chest pain, no SOB 

## 2013-06-17 NOTE — Assessment & Plan Note (Signed)
Dr. Ricki Miller follows will call his office for follow up labs.

## 2013-06-17 NOTE — Progress Notes (Signed)
06/17/2013   PCP: Pcp Not In System Dr. Ricki Miller   Chief Complaint  Patient presents with  . Follow-up    6 month visit    Primary Cardiologist: Dr. Allyson Sabal  HPI: 62 year old mildly overweight married Caucasian female mother of 1, grandmother of 2 grandchildren who I last saw 6 months ago. She has a history of hyperlipidemia and discontinued tobacco abuse. She had a positive Myoview April 07, 2010 with inferolateral ischemia which led to a heart cath February 17th that showed left main 2-vessel disease and normal LV function. She had coronary artery bypass grafting x3 by Dr. Ofilia Neas the following day with a LIMA to her LAD, a vein to the circumflex marginal branch. Her postoperative course has been uncomplicated. She completed cardiac rehab. Followup Myoview normalized without any evidence of any ischemic abnormality. Dr. Ricki Miller follows her lipid profile which when last performed in February revealed a total cholesterol of 125, LDL 55, HDL 48. She did retire the first of the year after working 30 years and her current job. Recent lab work performed Dr. Alverda Skeans revealed an increase in LDL from 55 in 2013 to 90 this year.  Today she has no chest pain no shortness of breath. She is walking 2 days possibly up for exercise she does state she feels her heart flutter. Briefly perhaps one beat and that's where we discussed caffeine use and over-the-counter decongestants with pseudoephedrine she should avoid more than 2 cups of coffee a day and should avoid pseudoephedrine.  She was also instructed to call his this fluttering increased.  She is somewhat frustrated that she cannot lose weight we discussed diet and exercise.  On her last visit she had carotid Dopplers followed by Dr. Allyson Sabal she had 50-69% diameter reduction of the right ICA which is stable compared to 05/2012 the left ICA is patent.   Allergies  Allergen Reactions  . Ramipril   . Sulfa Antibiotics     Current Outpatient  Prescriptions  Medication Sig Dispense Refill  . aspirin EC 81 MG tablet Take 81 mg by mouth daily.      Marland Kitchen estradiol (VIVELLE-DOT) 0.075 MG/24HR Apply 1/2 patch to skin 2 x week      . HYDROcodone-acetaminophen (NORCO/VICODIN) 5-325 MG per tablet       . losartan (COZAAR) 50 MG tablet Take 50 mg by mouth daily.      . methotrexate (RHEUMATREX) 2.5 MG tablet Take 2.5 mg by mouth once a week. Caution:Chemotherapy. Protect from light.  Take 8 tablet weekly      . metoprolol succinate (TOPROL-XL) 25 MG 24 hr tablet Take 25 mg by mouth daily.      . nitroGLYCERIN (NITROSTAT) 0.4 MG SL tablet Place 0.4 mg under the tongue every 5 (five) minutes as needed for chest pain.      . Progesterone Micronized (PROMETRIUM PO) Take by mouth. Take every morning      . rosuvastatin (CRESTOR) 20 MG tablet Take 20 mg by mouth daily.       No current facility-administered medications for this visit.    Past Medical History  Diagnosis Date  . Coronary artery disease   . Carotid artery occlusion   . Hyperlipidemia   . Carotid arterial disease, Rt. 06/17/2013    Past Surgical History  Procedure Laterality Date  . Nm myocar perf wall motion  11/28/2009    EF 81%  ;low risk  . Carotid dopler  05/27/2012  rgt ICA 50-69%; lft ICA norm  . Carotid doppler  11/22/2011  . Carotid doppler  09/08/2009    pre-op carotid  . Cardiac catheterization  09/08/2009    left main 2 vessel disease ww/high-grade obtuse marginal branch ,grade 2 collaterals needs acabg  . Coronary artery bypass graft  09/09/2009    [facility]  -LIMA to LAD,vein to Circ. marginal branch.  . Hysteroscopy  04/12/2011    Dr Nicholas Lose    ZOX:WRUEAVW:UJ colds or fevers, no weight changes Skin:no rashes or ulcers HEENT:no blurred vision, no congestion CV:see HPI PUL:see HPI GI:no diarrhea constipation or melena, no indigestion GU:no hematuria, no dysuria MS:no joint pain, no claudication Neuro:no syncope, no lightheadedness Endo:no diabetes, no  thyroid disease  PHYSICAL EXAM BP 120/70  Pulse 61  Ht 5\' 2"  (1.575 m)  Wt 184 lb (83.462 kg)  BMI 33.65 kg/m2 General:Pleasant affect, NAD Skin:Warm and dry, brisk capillary refill HEENT:normocephalic, sclera clear, mucus membranes moist Neck:supple, no JVD, no bruits  Heart:S1S2 RRR without murmur, gallup, rub or click Lungs:clear without rales, rhonchi, or wheezes WJX:BJYN, non tender, + BS, do not palpate liver spleen or masses Ext:no lower ext edema, 2+ pedal pulses, 2+ radial pulses Neuro:alert and oriented, MAE, follows commands, + facial symmetry  EKG: SR no acute changes.  ASSESSMENT AND PLAN CAD (coronary artery disease) with CABG 2011 Stable no chest pain, no SOB.  Hyperlipidemia Dr. Ricki Miller follows will call his office for follow up labs.  Carotid arterial disease, Rt. No change in carotid doppler.   She'll follow with Dr. Allyson Sabal in 6 months, she'll call she has any more fluttering or if there is a significant amount otherwise she'll continue to be healthy and to exercise  and also asked Dr. Ricki Miller for her last lipids that he had drawn.

## 2013-06-17 NOTE — Patient Instructions (Signed)
Carotid dopplers without change.  Continue to exercise and eat healthy.  Call if the "flutter" increase but limit caffeine intake.  Follow up with Dr. Allyson Sabal in 6 months

## 2013-06-17 NOTE — Assessment & Plan Note (Signed)
No change in carotid doppler.

## 2013-06-22 ENCOUNTER — Encounter: Payer: Self-pay | Admitting: Cardiology

## 2013-08-06 ENCOUNTER — Other Ambulatory Visit: Payer: Self-pay

## 2013-08-06 DIAGNOSIS — Z1231 Encounter for screening mammogram for malignant neoplasm of breast: Secondary | ICD-10-CM

## 2013-08-25 ENCOUNTER — Ambulatory Visit: Payer: BC Managed Care – PPO

## 2013-11-30 ENCOUNTER — Telehealth: Payer: Self-pay | Admitting: Cardiovascular Disease

## 2013-11-30 MED ORDER — ROSUVASTATIN CALCIUM 20 MG PO TABS: 20.0000 mg | ORAL_TABLET | Freq: Every day | ORAL | Status: DC

## 2013-11-30 MED ORDER — METOPROLOL SUCCINATE ER 25 MG PO TB24: 25.0000 mg | ORAL_TABLET | Freq: Every day | ORAL | Status: DC

## 2013-11-30 NOTE — Telephone Encounter (Signed)
Pt is changing pharmacy. Would you please call in her Metoprolol 25 mg #90 and Crestor 20 mg #90. Please call them to [facility]-[phone].

## 2013-11-30 NOTE — Telephone Encounter (Signed)
Rx was sent to pharmacy electronically. 

## 2013-12-03 ENCOUNTER — Other Ambulatory Visit: Payer: Self-pay | Admitting: *Deleted

## 2013-12-03 MED ORDER — ROSUVASTATIN CALCIUM 20 MG PO TABS: 20.0000 mg | ORAL_TABLET | Freq: Every day | ORAL | Status: DC

## 2013-12-03 NOTE — Telephone Encounter (Signed)
Rx was sent to pharmacy electronically. 

## 2013-12-22 ENCOUNTER — Encounter: Payer: Self-pay | Admitting: Cardiovascular Disease

## 2013-12-22 ENCOUNTER — Ambulatory Visit (INDEPENDENT_AMBULATORY_CARE_PROVIDER_SITE_OTHER): Payer: BC Managed Care – PPO | Admitting: Cardiovascular Disease

## 2013-12-22 VITALS — BP 142/84 | HR 62 | Ht 62.0 in | Wt 182.3 lb

## 2013-12-22 DIAGNOSIS — E785 Hyperlipidemia, unspecified: Secondary | ICD-10-CM

## 2013-12-22 DIAGNOSIS — I2510 Atherosclerotic heart disease of native coronary artery without angina pectoris: Secondary | ICD-10-CM

## 2013-12-22 DIAGNOSIS — I779 Disorder of arteries and arterioles, unspecified: Secondary | ICD-10-CM

## 2013-12-22 DIAGNOSIS — I739 Peripheral vascular disease, unspecified: Secondary | ICD-10-CM

## 2013-12-22 MED ORDER — NITROGLYCERIN 0.4 MG SL SUBL: 0.4000 mg | SUBLINGUAL_TABLET | SUBLINGUAL | Status: AC | PRN

## 2013-12-22 NOTE — Assessment & Plan Note (Signed)
History of CAD status post coronary bypass grafting February 2011 by Dr. Servando Snare with a LIMA to LAD, vein to the circumflex first marginal branch. Her last Myoview performed and was nonischemic. She denies chest pain or shortness of breath.

## 2013-12-22 NOTE — Patient Instructions (Signed)
Your physician recommends that you schedule a follow-up appointment in: 1 year  

## 2013-12-22 NOTE — Assessment & Plan Note (Signed)
On statin therapy followed by her PCP 

## 2013-12-22 NOTE — Assessment & Plan Note (Signed)
Moderate right ICA stenosis by duplex ultrasound which has remained stable. We'll continue to follow this noninvasively.

## 2013-12-22 NOTE — Progress Notes (Signed)
12/22/2013 Tammy [name]   September 27, 1950  [id number]  Primary Physician Pcp Not In System Primary Cardiologist: Lorretta Harp MD Renae Gloss   HPI:  The patient is a 63 year old mildly overweight married Caucasian female mother of 27, grandmother of 2 grandchildren who I last saw 6 months ago. She has a history of hyperlipidemia and discontinued tobacco abuse. She had a positive Myoview April 07, 2010 with inferolateral ischemia which led to a heart cath February 17th that showed left main 2-vessel disease and normal LV function. She had coronary artery bypass grafting x3 by Dr. Ceasar Mons the following day with a LIMA to her LAD, a vein to the circumflex marginal branch. Her postoperative course has been uncomplicated. She completed cardiac rehab. Followup Myoview normalized without any evidence of any ischemic abnormality.she has a new primary care physician, Claudia Pollock, in [place] who follows her lipid profile. She denies chest pain or shortness of breath.   Current Outpatient Prescriptions  Medication Sig Dispense Refill  . aspirin EC 81 MG tablet Take 81 mg by mouth daily.      Marland Kitchen estradiol (VIVELLE-DOT) 0.075 MG/24HR Apply 1/2 patch to skin 2 x week      . HYDROcodone-acetaminophen (NORCO/VICODIN) 5-325 MG per tablet       . losartan (COZAAR) 50 MG tablet Take 50 mg by mouth daily.      . methotrexate (RHEUMATREX) 2.5 MG tablet Take 2.5 mg by mouth once a week. Caution:Chemotherapy. Protect from light.  Take 8 tablet weekly      . metoprolol succinate (TOPROL-XL) 25 MG 24 hr tablet Take 1 tablet (25 mg total) by mouth daily.  90 tablet  1  . nitroGLYCERIN (NITROSTAT) 0.4 MG SL tablet Place 0.4 mg under the tongue every 5 (five) minutes as needed for chest pain.      . Progesterone Micronized (PROMETRIUM PO) Take by mouth. Take medication for 12 days straight every 3 months      . rosuvastatin (CRESTOR) 20 MG tablet Take 1 tablet (20 mg total) by  mouth daily.  90 tablet  1   No current facility-administered medications for this visit.    Allergies  Allergen Reactions  . Ramipril   . Sulfa Antibiotics     History   Social History  . Marital Status: Married    Spouse Name: N/A    Number of Children: N/A  . Years of Education: N/A   Occupational History  . Not on file.   Social History Main Topics  . Smoking status: Former Smoker    Quit date: 12/19/1987  . Smokeless tobacco: Not on file  . Alcohol Use: Not on file  . Drug Use: Not on file  . Sexual Activity: Not on file   Other Topics Concern  . Not on file   Social History Narrative  . No narrative on file     Review of Systems: General: negative for chills, fever, night sweats or weight changes.  Cardiovascular: negative for chest pain, dyspnea on exertion, edema, orthopnea, palpitations, paroxysmal nocturnal dyspnea or shortness of breath Dermatological: negative for rash Respiratory: negative for cough or wheezing Urologic: negative for hematuria Abdominal: negative for nausea, vomiting, diarrhea, bright red blood per rectum, melena, or hematemesis Neurologic: negative for visual changes, syncope, or dizziness All other systems reviewed and are otherwise negative except as noted above.    Blood pressure 142/84, pulse 62, height 5\' 2"  (1.575 m), weight 182 lb 4.8 oz (82.691  kg).  General appearance: alert and no distress Neck: no adenopathy, no carotid bruit, no JVD, supple, symmetrical, trachea midline and thyroid not enlarged, symmetric, no tenderness/mass/nodules Lungs: clear to auscultation bilaterally Heart: regular rate and rhythm, S1, S2 normal, no murmur, click, rub or gallop Extremities: extremities normal, atraumatic, no cyanosis or edema  EKG normal sinus rhythm at 62 with nonspecific ST and T wave changes  ASSESSMENT AND PLAN:   Hyperlipidemia On statin therapy followed by her PCP  Carotid arterial disease, Rt. Moderate right ICA  stenosis by duplex ultrasound which has remained stable. We'll continue to follow this noninvasively.  CAD (coronary artery disease) with CABG 2011 History of CAD status post coronary bypass grafting February 2011 by Dr. Servando Snare with a LIMA to LAD, vein to the circumflex first marginal branch. Her last Myoview performed and was nonischemic. She denies chest pain or shortness of breath.      Lorretta Harp MD FACP,FACC,FAHA, [facility] 12/22/2013 11:05 AM

## 2014-06-02 ENCOUNTER — Other Ambulatory Visit (HOSPITAL_COMMUNITY): Payer: Self-pay | Admitting: Cardiovascular Disease

## 2014-06-02 DIAGNOSIS — I6529 Occlusion and stenosis of unspecified carotid artery: Secondary | ICD-10-CM

## 2014-06-10 ENCOUNTER — Ambulatory Visit (HOSPITAL_COMMUNITY)
Admission: RE | Admit: 2014-06-10 | Discharge: 2014-06-10 | Disposition: A | Discharge status: Home / Self Care | Payer: BC Managed Care – PPO | Source: Ambulatory Visit | Attending: Cardiology | Admitting: Cardiology

## 2014-06-10 DIAGNOSIS — I672 Cerebral atherosclerosis: Secondary | ICD-10-CM | POA: Diagnosis present

## 2014-06-10 DIAGNOSIS — I6521 Occlusion and stenosis of right carotid artery: Secondary | ICD-10-CM | POA: Insufficient documentation

## 2014-06-10 DIAGNOSIS — I6529 Occlusion and stenosis of unspecified carotid artery: Secondary | ICD-10-CM

## 2014-06-10 DIAGNOSIS — I779 Disorder of arteries and arterioles, unspecified: Secondary | ICD-10-CM

## 2014-06-10 NOTE — Progress Notes (Signed)
Carotid Duplex Completed. Tammy [name], BS, RDMS, RVT  

## 2014-06-15 ENCOUNTER — Encounter: Payer: Self-pay | Admitting: *Deleted

## 2014-08-05 ENCOUNTER — Other Ambulatory Visit: Payer: Self-pay | Admitting: Cardiovascular Disease

## 2014-08-05 ENCOUNTER — Telehealth: Payer: Self-pay | Admitting: Cardiovascular Disease

## 2014-08-05 MED ORDER — METOPROLOL SUCCINATE ER 25 MG PO TB24: 25.0000 mg | ORAL_TABLET | Freq: Every day | ORAL | Status: DC

## 2014-08-05 MED ORDER — ROSUVASTATIN CALCIUM 20 MG PO TABS: 20.0000 mg | ORAL_TABLET | Freq: Every day | ORAL | Status: DC

## 2014-08-05 MED ORDER — LOSARTAN POTASSIUM 50 MG PO TABS: 50.0000 mg | ORAL_TABLET | Freq: Every day | ORAL | Status: DC

## 2014-08-05 NOTE — Telephone Encounter (Signed)
Refill submitted to patient's preferred pharmacy. Informed patient. Pt voiced understanding, no other stated concerns at this time.  

## 2014-08-05 NOTE — Telephone Encounter (Signed)
Pt called in stating that she will need a refill on her Metoprolol, Crestor and Losartan called in to the [facility] on [address] [facility] in [place]. Please call  Thanks

## 2014-08-27 ENCOUNTER — Telehealth: Payer: Self-pay | Admitting: Cardiovascular Disease

## 2014-08-27 NOTE — Telephone Encounter (Signed)
Route to [place]

## 2014-08-27 NOTE — Telephone Encounter (Signed)
Pt called in stating that since the change in her insurance, her Crestor is now going to cost her $250 and she can not afford that. She would like to know if there is a cheaper alternative for this medication. Please call back. She says that she has about 15 pills left  Thanks

## 2014-08-27 NOTE — Telephone Encounter (Signed)
Returned call to patient she stated she cannot afford Crestor.Message sent to [facility] for advice.

## 2014-08-30 MED ORDER — ATORVASTATIN CALCIUM 40 MG PO TABS: 40.0000 mg | ORAL_TABLET | Freq: Every day | ORAL | Status: DC

## 2014-08-30 NOTE — Telephone Encounter (Signed)
Spoke with patient, currently on Crestor 20 mg daily, no complaints.  Will switch to atorvastatin 40 mg daily.  Explained difference to patient.  (LDL on crestor 20 was 71)

## 2014-12-24 ENCOUNTER — Encounter: Payer: Self-pay | Admitting: Cardiovascular Disease

## 2014-12-31 ENCOUNTER — Ambulatory Visit: Payer: BLUE CROSS/BLUE SHIELD | Admitting: Cardiovascular Disease

## 2015-01-06 ENCOUNTER — Other Ambulatory Visit: Payer: Self-pay | Admitting: Cardiovascular Disease

## 2015-01-06 NOTE — Telephone Encounter (Signed)
Rx(s) sent to pharmacy electronically.  

## 2015-01-12 ENCOUNTER — Ambulatory Visit: Payer: BLUE CROSS/BLUE SHIELD | Admitting: Cardiovascular Disease

## 2015-01-26 ENCOUNTER — Encounter: Payer: Self-pay | Admitting: Cardiovascular Disease

## 2015-01-26 ENCOUNTER — Ambulatory Visit (INDEPENDENT_AMBULATORY_CARE_PROVIDER_SITE_OTHER): Payer: BLUE CROSS/BLUE SHIELD | Admitting: Cardiovascular Disease

## 2015-01-26 VITALS — BP 126/86 | HR 52 | Ht 62.0 in | Wt 185.0 lb

## 2015-01-26 DIAGNOSIS — I739 Peripheral vascular disease, unspecified: Secondary | ICD-10-CM

## 2015-01-26 DIAGNOSIS — I2583 Coronary atherosclerosis due to lipid rich plaque: Secondary | ICD-10-CM

## 2015-01-26 DIAGNOSIS — I779 Disorder of arteries and arterioles, unspecified: Secondary | ICD-10-CM

## 2015-01-26 DIAGNOSIS — E785 Hyperlipidemia, unspecified: Secondary | ICD-10-CM

## 2015-01-26 DIAGNOSIS — I2510 Atherosclerotic heart disease of native coronary artery without angina pectoris: Secondary | ICD-10-CM | POA: Diagnosis not present

## 2015-01-26 NOTE — Assessment & Plan Note (Signed)
History of hyperlipidemia with statin intolerance. I'm going to explore whether or not she is a candidate for the PC SK9 monoclonal injectable therapy

## 2015-01-26 NOTE — Assessment & Plan Note (Signed)
History of mild right ICA stenosis by duplex ultrasound performed in our office 06/10/14. She is neurologically dramatic. We will we will repeat this on an every other yearly basis

## 2015-01-26 NOTE — Assessment & Plan Note (Signed)
History of CAD status post cardiac catheterization performed in 2011 after a abnormal Myoview 04/07/10 showed inferolateral ischemia. This led to coronary but that has been seen by Dr. Servando Snare the LIMA to the LAD, vein to the circumflex obtuse marginal branch. Her postoperative course was uncomplicated. Follow-up Myoview showed normalization without any ischemic abnormalities. She denies chest pain or shortness of breath.

## 2015-01-26 NOTE — Patient Instructions (Addendum)
Your physician wants you to follow-up in: 1 year with Dr Gwenlyn Found. You will receive a reminder letter in the mail two months in advance. If you don't receive a letter, please call our office to schedule the follow-up appointment.  A FASTING lipid profile: to be done at your convenience.  There is a Psychologist, forensic lab on the first floor of this building, [address].  They are open from 8am-5pm with a lunch from 12-2.  You do not need an appointment.

## 2015-01-26 NOTE — Progress Notes (Signed)
01/26/2015 Tammy [name]   1951/07/08  [id number]  Primary Physician Claudia Pollock, MD Primary Cardiologist: Lorretta Harp MD Renae Gloss   HPI:  The patient is a 64 year old mildly overweight married Caucasian female mother of 22, grandmother of 2 grandchildren who I last saw 12 months ago. She has a history of hyperlipidemia and discontinued tobacco abuse. She had a positive Myoview April 07, 2010 with inferolateral ischemia which led to a heart cath February 17th that showed left main 2-vessel disease and normal LV function. She had coronary artery bypass grafting x3 by Dr. Ceasar Mons the following day with a LIMA to her LAD, a vein to the circumflex marginal branch. Her postoperative course has been uncomplicated. She completed cardiac rehab. Followup Myoview normalized without any evidence of any ischemic abnormality.she has a new primary care physician, Claudia Pollock, in [place] who follows her lipid profile. She denies chest pain or shortness of breath. She does admit symptoms compatible with statin intolerance.   Current Outpatient Prescriptions  Medication Sig Dispense Refill  . aspirin EC 81 MG tablet Take 81 mg by mouth daily.    . Coenzyme Q10 (CO Q 10) 100 MG CAPS Take 2 capsules by mouth daily.    Marland Kitchen etanercept (ENBREL) 50 MG/ML injection Inject 50 mg into the skin once a week.    Marland Kitchen HYDROcodone-acetaminophen (NORCO/VICODIN) 5-325 MG per tablet     . losartan (COZAAR) 50 MG tablet TAKE 1 TABLET (50 MG TOTAL) BY MOUTH DAILY. 90 tablet 2  . metoprolol succinate (TOPROL-XL) 25 MG 24 hr tablet TAKE 1 TABLET (25 MG TOTAL) BY MOUTH DAILY. 90 tablet 2  . Multiple Vitamin (MULTIVITAMIN) tablet Take 1 tablet by mouth daily.    . nitroGLYCERIN (NITROSTAT) 0.4 MG SL tablet Place 1 tablet (0.4 mg total) under the tongue every 5 (five) minutes as needed for chest pain. 25 tablet 2   No current facility-administered medications for this visit.     Allergies  Allergen Reactions  . Ramipril     cough  . Sulfa Antibiotics Swelling    History   Social History  . Marital Status: Married    Spouse Name: N/A  . Number of Children: N/A  . Years of Education: N/A   Occupational History  . Not on file.   Social History Main Topics  . Smoking status: Former Smoker    Quit date: 12/19/1987  . Smokeless tobacco: Not on file  . Alcohol Use: Not on file  . Drug Use: Not on file  . Sexual Activity: Not on file   Other Topics Concern  . Not on file   Social History Narrative     Review of Systems: General: negative for chills, fever, night sweats or weight changes.  Cardiovascular: negative for chest pain, dyspnea on exertion, edema, orthopnea, palpitations, paroxysmal nocturnal dyspnea or shortness of breath Dermatological: negative for rash Respiratory: negative for cough or wheezing Urologic: negative for hematuria Abdominal: negative for nausea, vomiting, diarrhea, bright red blood per rectum, melena, or hematemesis Neurologic: negative for visual changes, syncope, or dizziness All other systems reviewed and are otherwise negative except as noted above.    Blood pressure 126/86, pulse 52, height 5\' 2"  (1.575 m), weight 185 lb (83.915 kg).  General appearance: alert and no distress Neck: no adenopathy, no carotid bruit, no JVD, supple, symmetrical, trachea midline and thyroid not enlarged, symmetric, no tenderness/mass/nodules Lungs: clear to auscultation bilaterally Heart: regular rate and rhythm, S1, S2  normal, no murmur, click, rub or gallop Extremities: extremities normal, atraumatic, no cyanosis or edema  EKG sinus bradycardia 52 without ST or T-wave changes. I personally reviewed this EKG  ASSESSMENT AND PLAN:   Hyperlipidemia History of hyperlipidemia with statin intolerance. I'm going to explore whether or not she is a candidate for the PC SK9 monoclonal injectable therapy  Carotid arterial disease,  Rt. History of mild right ICA stenosis by duplex ultrasound performed in our office 06/10/14. She is neurologically dramatic. We will we will repeat this on an every other yearly basis  CAD (coronary artery disease) with CABG 2011 History of CAD status post cardiac catheterization performed in 2011 after a abnormal Myoview 04/07/10 showed inferolateral ischemia. This led to coronary but that has been seen by Dr. Servando Snare the LIMA to the LAD, vein to the circumflex obtuse marginal branch. Her postoperative course was uncomplicated. Follow-up Myoview showed normalization without any ischemic abnormalities. She denies chest pain or shortness of breath.      Lorretta Harp MD FACP,FACC,FAHA, [facility] 01/26/2015 4:04 PM

## 2015-11-16 ENCOUNTER — Other Ambulatory Visit: Payer: Self-pay | Admitting: Cardiovascular Disease

## 2015-11-17 NOTE — Telephone Encounter (Signed)
REFILL 

## 2016-02-10 ENCOUNTER — Ambulatory Visit: Payer: Self-pay | Admitting: Cardiovascular Disease

## 2016-02-13 ENCOUNTER — Other Ambulatory Visit: Payer: Self-pay | Admitting: Cardiovascular Disease

## 2016-03-13 ENCOUNTER — Ambulatory Visit (INDEPENDENT_AMBULATORY_CARE_PROVIDER_SITE_OTHER): Payer: BLUE CROSS/BLUE SHIELD | Admitting: Cardiovascular Disease

## 2016-03-13 ENCOUNTER — Encounter: Payer: Self-pay | Admitting: Cardiovascular Disease

## 2016-03-13 VITALS — BP 145/76 | HR 55 | Ht 62.0 in | Wt 195.0 lb

## 2016-03-13 DIAGNOSIS — E785 Hyperlipidemia, unspecified: Secondary | ICD-10-CM

## 2016-03-13 DIAGNOSIS — I779 Disorder of arteries and arterioles, unspecified: Secondary | ICD-10-CM | POA: Diagnosis not present

## 2016-03-13 DIAGNOSIS — I2583 Coronary atherosclerosis due to lipid rich plaque: Secondary | ICD-10-CM

## 2016-03-13 DIAGNOSIS — I2510 Atherosclerotic heart disease of native coronary artery without angina pectoris: Secondary | ICD-10-CM | POA: Diagnosis not present

## 2016-03-13 DIAGNOSIS — I739 Peripheral vascular disease, unspecified: Principal | ICD-10-CM

## 2016-03-13 NOTE — Progress Notes (Signed)
03/13/2016 Tammy [name]   11-09-1950  AC:[id number]  Primary Physician Claudia Pollock, MD Primary Cardiologist: Lorretta Harp MD Lupe Carney, [place]  HPI:  The patient is a 65 year old mildly overweight married Caucasian female mother of 63, grandmother of 2 grandchildren who I last saw 01/26/15. She has a history of hyperlipidemia and discontinued tobacco abuse. She had a positive Myoview April 07, 2010 with inferolateral ischemia which led to a heart cath February 17th that showed left main 2-vessel disease and normal LV function. She had coronary artery bypass grafting x3 by Dr. Ceasar Mons the following day with a LIMA to her LAD, a vein to the circumflex marginal branch. Her postoperative course has been uncomplicated. She completed cardiac rehab. Followup Myoview normalized without any evidence of any ischemic abnormality.she has a new primary care physician, Claudia Pollock, in [place] who follows her lipid profile. She denies chest pain or shortness of breath. She does admit symptoms compatible with statin intolerance and was recently begun on Livalo by her PCP.Marland Kitchen   Current Outpatient Prescriptions  Medication Sig Dispense Refill  . aspirin EC 81 MG tablet Take 81 mg by mouth daily.    . Coenzyme Q10 (CO Q 10) 100 MG CAPS Take 2 capsules by mouth daily.    Marland Kitchen etanercept (ENBREL) 50 MG/ML injection Inject 50 mg into the skin once a week.    . losartan (COZAAR) 50 MG tablet TAKE 1 TABLET (50 MG TOTAL) BY MOUTH DAILY. 90 tablet 2  . metoprolol succinate (TOPROL-XL) 25 MG 24 hr tablet TAKE 1 TABLET (25 MG TOTAL) BY MOUTH DAILY. 90 tablet 0  . Multiple Vitamin (MULTIVITAMIN) tablet Take 1 tablet by mouth daily.    . nitroGLYCERIN (NITROSTAT) 0.4 MG SL tablet Place 1 tablet (0.4 mg total) under the tongue every 5 (five) minutes as needed for chest pain. 25 tablet 2  . Pitavastatin Calcium (LIVALO) 2 MG TABS Take 2 mg by mouth daily.     No current  facility-administered medications for this visit.     Allergies  Allergen Reactions  . Ramipril     cough  . Sulfa Antibiotics Swelling    Social History   Social History  . Marital status: Married    Spouse name: N/A  . Number of children: N/A  . Years of education: N/A   Occupational History  . Not on file.   Social History Main Topics  . Smoking status: Former Smoker    Quit date: 12/19/1987  . Smokeless tobacco: Never Used  . Alcohol use Not on file  . Drug use: Unknown  . Sexual activity: Not on file   Other Topics Concern  . Not on file   Social History Narrative  . No narrative on file     Review of Systems: General: negative for chills, fever, night sweats or weight changes.  Cardiovascular: negative for chest pain, dyspnea on exertion, edema, orthopnea, palpitations, paroxysmal nocturnal dyspnea or shortness of breath Dermatological: negative for rash Respiratory: negative for cough or wheezing Urologic: negative for hematuria Abdominal: negative for nausea, vomiting, diarrhea, bright red blood per rectum, melena, or hematemesis Neurologic: negative for visual changes, syncope, or dizziness All other systems reviewed and are otherwise negative except as noted above.    Blood pressure (!) 145/76, pulse (!) 55, height 5\' 2"  (1.575 m), weight 195 lb (88.5 kg).  General appearance: alert and no distress Neck: no adenopathy, no carotid bruit, no JVD, supple, symmetrical, trachea  midline and thyroid not enlarged, symmetric, no tenderness/mass/nodules Lungs: clear to auscultation bilaterally Heart: regular rate and rhythm, S1, S2 normal, no murmur, click, rub or gallop Extremities: extremities normal, atraumatic, no cyanosis or edema  EKG sinus bradycardia at 55 with nonspecific ST and T-wave changes. I personally reviewed this EKG  ASSESSMENT AND PLAN:   CAD (coronary artery disease) with CABG 2011 History of CAD status post coronary artery bypass  grafting by Dr. Ceasar Mons February 2011 for catheterization documented left main/2 vessel disease with normal LV function. She had a LIMA to LAD and a vein to the circumflex branch. Her postoperative course was uncomplicated. Stress test performed postop showed no abnormality. She denies chest pain or shortness of breath.  Carotid arterial disease, Rt. History of moderate right internal carotid artery stenosis by duplex ultrasound November 2015. She is neurologically asymptomatic. We will repeat a carotid Doppler study  Hyperlipidemia History of hyperlipidemia intolerant to statin drugs. She was recently begun on Livalo by her PCP who is following this as an outpatient.      Lorretta Harp MD FACP,FACC,FAHA, [facility] 03/13/2016 8:33 AM

## 2016-03-13 NOTE — Patient Instructions (Signed)
Medication Instructions:  Your physician recommends that you continue on your current medications as directed. Please refer to the Current Medication list given to you today.   Testing/Procedures: Your physician has requested that you have a carotid duplex. This test is an ultrasound of the carotid arteries in your neck. It looks at blood flow through these arteries that supply the brain with blood. Allow one hour for this exam. There are no restrictions or special instructions.   Follow-Up: Your physician wants you to follow-up in: [place]. You will receive a reminder letter in the mail two months in advance. If you don't receive a letter, please call our office to schedule the follow-up appointment.  If you need a refill on your cardiac medications before your next appointment, please call your pharmacy.

## 2016-03-13 NOTE — Assessment & Plan Note (Signed)
History of hyperlipidemia intolerant to statin drugs. She was recently begun on Livalo by her PCP who is following this as an outpatient.

## 2016-03-13 NOTE — Assessment & Plan Note (Signed)
History of CAD status post coronary artery bypass grafting by Dr. Ceasar Mons February 2011 for catheterization documented left main/2 vessel disease with normal LV function. She had a LIMA to LAD and a vein to the circumflex branch. Her postoperative course was uncomplicated. Stress test performed postop showed no abnormality. She denies chest pain or shortness of breath.

## 2016-03-13 NOTE — Assessment & Plan Note (Signed)
History of moderate right internal carotid artery stenosis by duplex ultrasound November 2015. She is neurologically asymptomatic. We will repeat a carotid Doppler study

## 2016-03-20 ENCOUNTER — Ambulatory Visit (HOSPITAL_COMMUNITY)
Admission: RE | Admit: 2016-03-20 | Discharge: 2016-03-20 | Disposition: A | Discharge status: Home / Self Care | Payer: BLUE CROSS/BLUE SHIELD | Source: Ambulatory Visit | Attending: Internal Medicine | Admitting: Internal Medicine

## 2016-03-20 DIAGNOSIS — I779 Disorder of arteries and arterioles, unspecified: Secondary | ICD-10-CM | POA: Insufficient documentation

## 2016-03-20 DIAGNOSIS — Z87891 Personal history of nicotine dependence: Secondary | ICD-10-CM | POA: Insufficient documentation

## 2016-03-20 DIAGNOSIS — R42 Dizziness and giddiness: Secondary | ICD-10-CM | POA: Insufficient documentation

## 2016-03-20 DIAGNOSIS — E785 Hyperlipidemia, unspecified: Secondary | ICD-10-CM | POA: Insufficient documentation

## 2016-03-20 DIAGNOSIS — I2510 Atherosclerotic heart disease of native coronary artery without angina pectoris: Secondary | ICD-10-CM | POA: Diagnosis not present

## 2016-03-20 DIAGNOSIS — I739 Peripheral vascular disease, unspecified: Secondary | ICD-10-CM

## 2016-03-20 DIAGNOSIS — I6523 Occlusion and stenosis of bilateral carotid arteries: Secondary | ICD-10-CM | POA: Insufficient documentation

## 2016-03-20 DIAGNOSIS — I2583 Coronary atherosclerosis due to lipid rich plaque: Secondary | ICD-10-CM

## 2016-03-22 ENCOUNTER — Other Ambulatory Visit: Payer: Self-pay | Admitting: *Deleted

## 2016-03-22 DIAGNOSIS — I739 Peripheral vascular disease, unspecified: Principal | ICD-10-CM

## 2016-03-22 DIAGNOSIS — I779 Disorder of arteries and arterioles, unspecified: Secondary | ICD-10-CM

## 2016-04-16 ENCOUNTER — Other Ambulatory Visit: Payer: Self-pay | Admitting: Cardiovascular Disease

## 2016-05-11 ENCOUNTER — Other Ambulatory Visit: Payer: Self-pay | Admitting: Cardiovascular Disease

## 2016-08-14 ENCOUNTER — Other Ambulatory Visit: Payer: Self-pay | Admitting: Cardiovascular Disease

## 2016-08-14 NOTE — Telephone Encounter (Signed)
REFILL 

## 2017-01-18 ENCOUNTER — Other Ambulatory Visit: Payer: Self-pay | Admitting: Cardiovascular Disease

## 2017-03-13 ENCOUNTER — Ambulatory Visit: Payer: BLUE CROSS/BLUE SHIELD | Admitting: Cardiovascular Disease

## 2017-08-27 ENCOUNTER — Other Ambulatory Visit: Payer: Self-pay | Admitting: Cardiovascular Disease

## 2017-08-27 MED ORDER — METOPROLOL SUCCINATE ER 25 MG PO TB24: 25.0000 mg | ORAL_TABLET | Freq: Every day | ORAL | 0 refills | Status: DC

## 2017-08-27 NOTE — Telephone Encounter (Signed)
New message      *STAT* If patient is at the pharmacy, call can be transferred to refill team.   1. Which medications need to be refilled? (please list name of each medication and dose if known) metoprolol succinate (TOPROL-XL) 25 MG 24 hr tablet  2. Which pharmacy/location (including street and city if local pharmacy) is medication to be sent to?[id number] - [place], [place] - [address]  3. Do they need a 30 day or 90 day supply? [place]

## 2017-08-27 NOTE — Telephone Encounter (Signed)
Rx(s) sent to pharmacy electronically.  

## 2017-08-28 ENCOUNTER — Other Ambulatory Visit: Payer: Self-pay | Admitting: Cardiovascular Disease

## 2017-09-05 ENCOUNTER — Other Ambulatory Visit: Payer: Self-pay | Admitting: *Deleted

## 2017-09-09 ENCOUNTER — Other Ambulatory Visit: Payer: Self-pay | Admitting: Cardiovascular Disease

## 2017-12-06 DIAGNOSIS — Z6833 Body mass index (BMI) 33.0-33.9, adult: Secondary | ICD-10-CM | POA: Diagnosis not present

## 2017-12-06 DIAGNOSIS — R5382 Chronic fatigue, unspecified: Secondary | ICD-10-CM | POA: Diagnosis not present

## 2017-12-06 DIAGNOSIS — E669 Obesity, unspecified: Secondary | ICD-10-CM | POA: Diagnosis not present

## 2017-12-06 DIAGNOSIS — E7800 Pure hypercholesterolemia, unspecified: Secondary | ICD-10-CM | POA: Diagnosis not present

## 2017-12-06 DIAGNOSIS — M150 Primary generalized (osteo)arthritis: Secondary | ICD-10-CM | POA: Diagnosis not present

## 2017-12-06 DIAGNOSIS — E785 Hyperlipidemia, unspecified: Secondary | ICD-10-CM | POA: Diagnosis not present

## 2017-12-06 DIAGNOSIS — M0589 Other rheumatoid arthritis with rheumatoid factor of multiple sites: Secondary | ICD-10-CM | POA: Diagnosis not present

## 2017-12-19 DIAGNOSIS — R42 Dizziness and giddiness: Secondary | ICD-10-CM | POA: Diagnosis not present

## 2017-12-19 DIAGNOSIS — M546 Pain in thoracic spine: Secondary | ICD-10-CM | POA: Diagnosis not present

## 2017-12-19 DIAGNOSIS — R350 Frequency of micturition: Secondary | ICD-10-CM | POA: Diagnosis not present

## 2018-02-12 DIAGNOSIS — E785 Hyperlipidemia, unspecified: Secondary | ICD-10-CM | POA: Diagnosis not present

## 2018-02-12 DIAGNOSIS — Z0000 Encounter for general adult medical examination without abnormal findings: Secondary | ICD-10-CM | POA: Diagnosis not present

## 2018-02-12 DIAGNOSIS — Z6833 Body mass index (BMI) 33.0-33.9, adult: Secondary | ICD-10-CM | POA: Diagnosis not present

## 2018-02-25 DIAGNOSIS — Z1231 Encounter for screening mammogram for malignant neoplasm of breast: Secondary | ICD-10-CM | POA: Diagnosis not present

## 2018-03-14 DIAGNOSIS — E669 Obesity, unspecified: Secondary | ICD-10-CM | POA: Diagnosis not present

## 2018-03-14 DIAGNOSIS — Z6833 Body mass index (BMI) 33.0-33.9, adult: Secondary | ICD-10-CM | POA: Diagnosis not present

## 2018-03-14 DIAGNOSIS — M150 Primary generalized (osteo)arthritis: Secondary | ICD-10-CM | POA: Diagnosis not present

## 2018-03-14 DIAGNOSIS — M0589 Other rheumatoid arthritis with rheumatoid factor of multiple sites: Secondary | ICD-10-CM | POA: Diagnosis not present

## 2018-03-14 DIAGNOSIS — R5382 Chronic fatigue, unspecified: Secondary | ICD-10-CM | POA: Diagnosis not present

## 2018-04-04 DIAGNOSIS — J019 Acute sinusitis, unspecified: Secondary | ICD-10-CM | POA: Diagnosis not present

## 2018-04-04 DIAGNOSIS — J40 Bronchitis, not specified as acute or chronic: Secondary | ICD-10-CM | POA: Diagnosis not present

## 2018-04-16 DIAGNOSIS — Z7982 Long term (current) use of aspirin: Secondary | ICD-10-CM | POA: Diagnosis not present

## 2018-04-16 DIAGNOSIS — I10 Essential (primary) hypertension: Secondary | ICD-10-CM | POA: Diagnosis not present

## 2018-04-16 DIAGNOSIS — I2510 Atherosclerotic heart disease of native coronary artery without angina pectoris: Secondary | ICD-10-CM | POA: Diagnosis not present

## 2018-04-16 DIAGNOSIS — Z951 Presence of aortocoronary bypass graft: Secondary | ICD-10-CM | POA: Diagnosis not present

## 2018-04-25 DIAGNOSIS — Z23 Encounter for immunization: Secondary | ICD-10-CM | POA: Diagnosis not present

## 2018-05-28 DIAGNOSIS — Z86010 Personal history of colonic polyps: Secondary | ICD-10-CM | POA: Diagnosis not present

## 2018-05-28 DIAGNOSIS — K648 Other hemorrhoids: Secondary | ICD-10-CM | POA: Diagnosis not present

## 2018-06-16 DIAGNOSIS — M0589 Other rheumatoid arthritis with rheumatoid factor of multiple sites: Secondary | ICD-10-CM | POA: Diagnosis not present

## 2018-06-16 DIAGNOSIS — M150 Primary generalized (osteo)arthritis: Secondary | ICD-10-CM | POA: Diagnosis not present

## 2018-06-16 DIAGNOSIS — R5382 Chronic fatigue, unspecified: Secondary | ICD-10-CM | POA: Diagnosis not present

## 2018-06-16 DIAGNOSIS — Z6832 Body mass index (BMI) 32.0-32.9, adult: Secondary | ICD-10-CM | POA: Diagnosis not present

## 2018-06-16 DIAGNOSIS — E669 Obesity, unspecified: Secondary | ICD-10-CM | POA: Diagnosis not present

## 2018-07-02 DIAGNOSIS — J40 Bronchitis, not specified as acute or chronic: Secondary | ICD-10-CM | POA: Diagnosis not present

## 2018-09-19 DIAGNOSIS — M0589 Other rheumatoid arthritis with rheumatoid factor of multiple sites: Secondary | ICD-10-CM | POA: Diagnosis not present

## 2018-12-03 DIAGNOSIS — M150 Primary generalized (osteo)arthritis: Secondary | ICD-10-CM | POA: Diagnosis not present

## 2018-12-03 DIAGNOSIS — M0589 Other rheumatoid arthritis with rheumatoid factor of multiple sites: Secondary | ICD-10-CM | POA: Diagnosis not present

## 2018-12-03 DIAGNOSIS — R5382 Chronic fatigue, unspecified: Secondary | ICD-10-CM | POA: Diagnosis not present

## 2018-12-04 DIAGNOSIS — M0589 Other rheumatoid arthritis with rheumatoid factor of multiple sites: Secondary | ICD-10-CM | POA: Diagnosis not present

## 2019-02-20 DIAGNOSIS — L578 Other skin changes due to chronic exposure to nonionizing radiation: Secondary | ICD-10-CM | POA: Diagnosis not present

## 2019-02-20 DIAGNOSIS — L814 Other melanin hyperpigmentation: Secondary | ICD-10-CM | POA: Diagnosis not present

## 2019-02-20 DIAGNOSIS — Z85828 Personal history of other malignant neoplasm of skin: Secondary | ICD-10-CM | POA: Diagnosis not present

## 2019-02-20 DIAGNOSIS — L820 Inflamed seborrheic keratosis: Secondary | ICD-10-CM | POA: Diagnosis not present

## 2019-02-20 DIAGNOSIS — L570 Actinic keratosis: Secondary | ICD-10-CM | POA: Diagnosis not present

## 2019-02-20 DIAGNOSIS — L821 Other seborrheic keratosis: Secondary | ICD-10-CM | POA: Diagnosis not present

## 2019-03-10 DIAGNOSIS — Z6832 Body mass index (BMI) 32.0-32.9, adult: Secondary | ICD-10-CM | POA: Diagnosis not present

## 2019-03-10 DIAGNOSIS — M150 Primary generalized (osteo)arthritis: Secondary | ICD-10-CM | POA: Diagnosis not present

## 2019-03-10 DIAGNOSIS — R5382 Chronic fatigue, unspecified: Secondary | ICD-10-CM | POA: Diagnosis not present

## 2019-03-10 DIAGNOSIS — M0589 Other rheumatoid arthritis with rheumatoid factor of multiple sites: Secondary | ICD-10-CM | POA: Diagnosis not present

## 2019-03-10 DIAGNOSIS — E669 Obesity, unspecified: Secondary | ICD-10-CM | POA: Diagnosis not present

## 2019-03-16 DIAGNOSIS — M0589 Other rheumatoid arthritis with rheumatoid factor of multiple sites: Secondary | ICD-10-CM | POA: Diagnosis not present

## 2019-04-07 DIAGNOSIS — Z23 Encounter for immunization: Secondary | ICD-10-CM | POA: Diagnosis not present

## 2019-04-21 DIAGNOSIS — M0589 Other rheumatoid arthritis with rheumatoid factor of multiple sites: Secondary | ICD-10-CM | POA: Diagnosis not present

## 2019-04-22 DIAGNOSIS — E7800 Pure hypercholesterolemia, unspecified: Secondary | ICD-10-CM | POA: Diagnosis not present

## 2019-04-22 DIAGNOSIS — I10 Essential (primary) hypertension: Secondary | ICD-10-CM | POA: Diagnosis not present

## 2019-04-22 DIAGNOSIS — I2510 Atherosclerotic heart disease of native coronary artery without angina pectoris: Secondary | ICD-10-CM | POA: Diagnosis not present

## 2019-06-09 DIAGNOSIS — J42 Unspecified chronic bronchitis: Secondary | ICD-10-CM | POA: Diagnosis not present

## 2019-06-10 DIAGNOSIS — M0589 Other rheumatoid arthritis with rheumatoid factor of multiple sites: Secondary | ICD-10-CM | POA: Diagnosis not present

## 2019-06-10 DIAGNOSIS — M150 Primary generalized (osteo)arthritis: Secondary | ICD-10-CM | POA: Diagnosis not present

## 2019-06-10 DIAGNOSIS — R5382 Chronic fatigue, unspecified: Secondary | ICD-10-CM | POA: Diagnosis not present

## 2019-06-23 DIAGNOSIS — M0589 Other rheumatoid arthritis with rheumatoid factor of multiple sites: Secondary | ICD-10-CM | POA: Diagnosis not present

## 2019-06-25 DIAGNOSIS — Z1231 Encounter for screening mammogram for malignant neoplasm of breast: Secondary | ICD-10-CM | POA: Diagnosis not present
